# Patient Record
Sex: Female | Born: 1995 | Race: White | Hispanic: No | Marital: Married | Smoking: Never smoker
Health system: Southern US, Community
[De-identification: ages and names within clinical notes are randomized; demographics above are authoritative.]

## PROBLEM LIST (undated history)

## (undated) DIAGNOSIS — F909 Attention-deficit hyperactivity disorder, unspecified type: Secondary | ICD-10-CM

## (undated) HISTORY — DX: Attention-deficit hyperactivity disorder, unspecified type: F90.9

---
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
## 2016-12-04 ENCOUNTER — Observation Stay
Admission: EM | Admit: 2016-12-04 | Discharge: 2016-12-07 | Disposition: A | Discharge status: Home Health | Payer: TRICARE Prime—HMO | Attending: Hospitalist | Admitting: Hospitalist

## 2016-12-04 DIAGNOSIS — R200 Anesthesia of skin: Secondary | ICD-10-CM | POA: Insufficient documentation

## 2016-12-04 DIAGNOSIS — G8250 Quadriplegia, unspecified: Secondary | ICD-10-CM | POA: Diagnosis present

## 2016-12-04 DIAGNOSIS — F909 Attention-deficit hyperactivity disorder, unspecified type: Secondary | ICD-10-CM | POA: Insufficient documentation

## 2016-12-04 DIAGNOSIS — R531 Weakness: Secondary | ICD-10-CM | POA: Insufficient documentation

## 2016-12-04 DIAGNOSIS — R269 Unspecified abnormalities of gait and mobility: Secondary | ICD-10-CM

## 2016-12-04 DIAGNOSIS — R202 Paresthesia of skin: Principal | ICD-10-CM | POA: Insufficient documentation

## 2016-12-04 DIAGNOSIS — R2681 Unsteadiness on feet: Secondary | ICD-10-CM

## 2016-12-04 LAB — CBC AND DIFFERENTIAL
Absolute NRBC: 0 10*3/uL
Basophils Absolute Automated: 0.03 10*3/uL (ref 0.00–0.20)
Basophils Automated: 0.4 %
Eosinophils Absolute Automated: 0.25 10*3/uL (ref 0.00–0.70)
Eosinophils Automated: 3.7 %
Hematocrit: 39.2 % (ref 37.0–47.0)
Hgb: 13.7 g/dL (ref 12.0–16.0)
Immature Granulocytes Absolute: 0.03 10*3/uL
Immature Granulocytes: 0.4 %
Lymphocytes Absolute Automated: 2.7 10*3/uL (ref 0.50–4.40)
Lymphocytes Automated: 39.8 %
MCH: 29.7 pg (ref 28.0–32.0)
MCHC: 34.9 g/dL (ref 32.0–36.0)
MCV: 85 fL (ref 80.0–100.0)
MPV: 9.2 fL — ABNORMAL LOW (ref 9.4–12.3)
Monocytes Absolute Automated: 0.36 10*3/uL (ref 0.00–1.20)
Monocytes: 5.3 %
Neutrophils Absolute: 3.41 10*3/uL (ref 1.80–8.10)
Neutrophils: 50.4 %
Nucleated RBC: 0 /100 WBC (ref 0.0–1.0)
Platelets: 319 10*3/uL (ref 140–400)
RBC: 4.61 10*6/uL (ref 4.20–5.40)
RDW: 12 % (ref 12–15)
WBC: 6.78 10*3/uL (ref 3.50–10.80)

## 2016-12-04 LAB — URINALYSIS, REFLEX TO MICROSCOPIC EXAM IF INDICATED
Bilirubin, UA: NEGATIVE
Blood, UA: NEGATIVE
Glucose, UA: NEGATIVE
Ketones UA: NEGATIVE
Leukocyte Esterase, UA: NEGATIVE
Nitrite, UA: NEGATIVE
Protein, UR: NEGATIVE
Specific Gravity UA: 1.009 (ref 1.001–1.035)
Urine pH: 6 (ref 5.0–8.0)
Urobilinogen, UA: NORMAL mg/dL

## 2016-12-04 LAB — GFR: EGFR: >60

## 2016-12-04 LAB — MAGNESIUM: Magnesium: 2.2 mg/dL (ref 1.6–2.6)

## 2016-12-04 LAB — HEPATIC FUNCTION PANEL
ALT: 21 U/L (ref 0–55)
AST (SGOT): 17 U/L (ref 5–34)
Albumin/Globulin Ratio: 1.3 (ref 0.9–2.2)
Albumin: 4 g/dL (ref 3.5–5.0)
Alkaline Phosphatase: 48 U/L (ref 37–106)
Bilirubin Direct: 0.1 mg/dL (ref 0.0–0.5)
Bilirubin Indirect: 0.2 mg/dL (ref 0.0–1.1)
Bilirubin, Total: 0.3 mg/dL (ref 0.2–1.2)
Globulin: 3.2 g/dL (ref 2.0–3.6)
Protein, Total: 7.2 g/dL (ref 6.0–8.3)

## 2016-12-04 LAB — BASIC METABOLIC PANEL
BUN: 9 mg/dL (ref 7.0–19.0)
CO2: 25 mEq/L (ref 22–29)
Calcium: 9.8 mg/dL (ref 8.5–10.5)
Chloride: 106 mEq/L (ref 100–111)
Creatinine: 0.7 mg/dL (ref 0.6–1.0)
Glucose: 80 mg/dL (ref 70–100)
Potassium: 3.9 mEq/L (ref 3.5–5.1)
Sodium: 139 mEq/L (ref 136–145)

## 2016-12-04 LAB — POCT PREGNANCY TEST, URINE HCG: POCT Pregnancy HCG Test, UR: NEGATIVE

## 2016-12-04 LAB — PHOSPHORUS: Phosphorus: 4 mg/dL (ref 2.3–4.7)

## 2016-12-04 MED ORDER — SENNOSIDES-DOCUSATE SODIUM 8.6-50 MG PO TABS
2.0000 | ORAL_TABLET | Freq: Two times a day (BID) | ORAL | Status: DC | PRN
Start: 2016-12-04 — End: 2016-12-07

## 2016-12-04 MED ORDER — ACETAMINOPHEN 325 MG PO TABS
650.0000 mg | ORAL_TABLET | Freq: Four times a day (QID) | ORAL | Status: DC | PRN
Start: 2016-12-04 — End: 2016-12-07
  Administered 2016-12-05 – 2016-12-07 (×4): 650 mg via ORAL
  Filled 2016-12-04 (×4): qty 2

## 2016-12-04 MED ORDER — ONDANSETRON 4 MG PO TBDP
4.0000 mg | ORAL_TABLET | Freq: Four times a day (QID) | ORAL | Status: DC | PRN
Start: 2016-12-04 — End: 2016-12-07

## 2016-12-04 MED ORDER — CALCIUM CARBONATE ANTACID 500 MG PO CHEW
1000.0000 mg | CHEWABLE_TABLET | Freq: Four times a day (QID) | ORAL | Status: DC | PRN
Start: 2016-12-04 — End: 2016-12-07

## 2016-12-04 MED ORDER — ENOXAPARIN SODIUM 40 MG/0.4ML SC SOLN
40.0000 mg | Freq: Every day | SUBCUTANEOUS | Status: DC
Start: 2016-12-05 — End: 2016-12-05

## 2016-12-04 MED ORDER — SODIUM CHLORIDE 0.9 % IV BOLUS
1000.0000 mL | Freq: Once | INTRAVENOUS | Status: AC
Start: 2016-12-04 — End: 2016-12-04
  Administered 2016-12-04: 21:00:00 1000 mL via INTRAVENOUS

## 2016-12-04 MED ORDER — ONDANSETRON HCL 4 MG/2ML IJ SOLN
4.0000 mg | Freq: Four times a day (QID) | INTRAMUSCULAR | Status: DC | PRN
Start: 2016-12-04 — End: 2016-12-07

## 2016-12-04 NOTE — Progress Notes (Signed)
12/04/16 2256   CM Review   Acknowledgment of Outpatient/Observation Observation letter given                 Everardo Pacific, MSW  Emergency Department Social Worker  [facility]  [phone]  [email or web address]

## 2016-12-04 NOTE — ED Notes (Signed)
Initial brief evaluation done in triage to expedite care but I am not the primary provider. Pt presents to triage with progressive numbness and weakness for 2weeks. MRI brain normal at [facility]. Sent in by PCP to evaluate for Gb and/or MS       Juliane Poot, MD  12/04/16 2040

## 2016-12-04 NOTE — ED Provider Notes (Addendum)
[facility] EMERGENCY DEPARTMENT H&P      Visit date: 12/04/2016      CLINICAL SUMMARY          Diagnosis:    .     Final diagnoses:   Paresthesia   Gait disorder         MDM Notes:      I am the first provider for this patient.  I reviewed the vital signs, nursing notes, past medical history, past surgical history, family history and social history.  I have reviewed the patient's previous charts.    Considering pt's history, physical, lab results/imaging will admit for further care.  Discussed with admitting doctor observation/inpatient status.         Disposition:         Observation Admit      ED Disposition     ED Disposition Condition Date/Time Comment    Observation  Sun Dec 04, 2016 10:34 PM Admitting Physician: Sheran Luz [facility] [[id number]]   Diagnosis: Paresthesia [604540]   Estimated Length of Stay: < 2 midnights   Tentative Discharge Plan?: Home or Self Care [1]   Patient Class: Observation [104]                        CLINICAL INFORMATION        HPI:      Chief Complaint: Numbness  .    Meredith [name] is a 21 y.o. female with past medical history of ADHD on adderall who presents with right-sided numbness and tingling for 2 weeks. Reports that numbness started on her right lower extremity and then spread to her right arm and the right-side of her face. Also reports difficulty in taking a deep breath. Recently admitted to [facility] but had a negative MRI. Denies headache or vision change. Has appointment with neurology in mid-July.    History obtained from: Patient      ROS:      Positive and Negative ROS elements as per HPI  All Other Systems Reviewed and Negative: Yes      Physical Exam:      Pulse (!) 102  BP (!) 144/96  Resp 18  SpO2 99 %  Temp 97.7 F (36.5 C)    Physical Exam   Nursing note and vitals reviewed.  Constitutional:Pt is oriented to person, place, and time. Pt appears well-developed and well-nourished. No distress. Anxious appearing.   HEENT:   Head: Normocephalic and atraumatic.    Right Ear: External ear normal.   Left Ear: External ear normal.   Nose: Nose normal.   Mouth/Throat: Oropharynx is clear and moist. No oropharyngeal exudate.   Eyes: Conjunctivae and EOM are normal. Pupils are equal, round, and reactive to light. Right eye exhibits no discharge. Left eye exhibits no discharge. No scleral icterus.   Neck: Normal range of motion. Neck supple. No JVD present. No tracheal deviation present. No thyromegaly present.   Cardiovascular: Normal rate, regular rhythm, normal heart sounds and intact distal pulses.  Exam reveals no gallop and no friction rub.    No murmur heard.  Pulmonary/Chest: Effort normal and breath sounds normal. No stridor. No respiratory distress. Pt has no wheezes. Pt exhibits no tenderness.   Abdominal: There is no tenderness. Soft. Bowel sounds are normal. Pt exhibits no distension and no mass.  There is no rebound and no guarding.   Musculoskeletal:  No edema. FROM  Neurological:  No cranial nerve deficit. Pt exhibits  normal muscle tone. Moderate difficulty ambulation. Light touch decreased, pain intact   Skin: Skin is warm and dry. Pt is not diaphoretic.   Psychiatric: Pt has a normal mood and affect. Behavior is normal. Judgment and thought content normal            PAST HISTORY        Primary Care Provider: Bubba Camp, MD        PMH/PSH:    .     Past Medical History:   Diagnosis Date   . ADHD        She has a past surgical history that includes Cholecystectomy and Ovarian cyst removal.      Social/Family History:      She reports that she has never smoked. She has never used smokeless tobacco. She reports that she does not drink alcohol or use drugs.    Family History   Problem Relation Age of Onset   . Stroke Brother          Listed Medications on Arrival:    .     Home Medications     Med List Status:  Complete Set By: Meredith Peters, RN at 12/04/2016 11:37 PM                Amphetamine-Dextroamphetamine (ADDERALL PO)     Take 15 mg by mouth 2  (two) times daily.         norelgestromin-ethinyl estradiol (ORTHO EVRA) 150-35 MCG/24HR     Place 1 patch onto the skin Once each week on Thursday evening.         Allergies: She has No Known Allergies.            VISIT INFORMATION        Clinical Course in the ED:             Medications Given in the ED:    .     ED Medication Orders     Start Ordered     Status Ordering Provider    12/04/16 2043 12/04/16 2042  sodium chloride 0.9 % bolus 1,000 mL  Once     Route: Intravenous  Ordered Dose: 1,000 mL     Last MAR action:  New Bag Meredith [name] [facility]            Procedures:      Procedures      Interpretations:      Pulse ox reviewed by me. All ordered labs and images reviewed by me.     O2 [name]-           saturation: 99 %; Oxygen use: room air; Interpretation: Normal                   RESULTS        Lab Results:      Results     Procedure Component Value Units Date/Time    Urine HCG, POC/ Qualitative [[id number]] Collected:  12/04/16 2127    Specimen:  Urine Updated:  12/04/16 2212     POCT QC Pass     POCT Pregnancy HCG Test, UR Negative     Comment: Negative Value is Normal in Healthy Males or Healthy non-pregnant Females    UA, Reflex to Microscopic (pts  3 + yrs) [[id number]] Collected:  12/04/16 2059    Specimen:  Urine Updated:  12/04/16 2136     Urine Type Clean Catch     Color, UA Straw  Clarity, UA Clear     Specific Gravity UA 1.009     Urine pH 6.0     Leukocyte Esterase, UA Negative     Nitrite, UA Negative     Protein, UR Negative     Glucose, UA Negative     Ketones UA Negative     Urobilinogen, UA Normal mg/dL      Bilirubin, UA Negative     Blood, UA Negative    Basic Metabolic Panel [[id number]] Collected:  12/04/16 2044    Specimen:  Blood Updated:  12/04/16 2116     Glucose 80 mg/dL      BUN 9.0 mg/dL      Creatinine 0.7 mg/dL      Calcium 9.8 mg/dL      Sodium 914 mEq/L      Potassium 3.9 mEq/L      Chloride 106 mEq/L      CO2 25 mEq/L     Hepatic function panel (LFT) [[id number]] Collected:   12/04/16 2044    Specimen:  Blood Updated:  12/04/16 2116     Bilirubin, Total 0.3 mg/dL      Bilirubin, Direct 0.1 mg/dL      Bilirubin, Indirect 0.2 mg/dL      AST (SGOT) 17 U/L      ALT 21 U/L      Alkaline Phosphatase 48 U/L      Protein, Total 7.2 g/dL      Albumin 4.0 g/dL      Globulin 3.2 g/dL      Albumin/Globulin Ratio 1.3    Magnesium [[id number]] Collected:  12/04/16 2044    Specimen:  Blood Updated:  12/04/16 2116     Magnesium 2.2 mg/dL     Phosphorus [[id number]] Collected:  12/04/16 2044    Specimen:  Blood Updated:  12/04/16 2116     Phosphorus 4.0 mg/dL     GFR [[id number]] Collected:  12/04/16 2044     Updated:  12/04/16 2116     EGFR >60.0    CBC with differential [[id number]]  (Abnormal) Collected:  12/04/16 2044    Specimen:  Blood from Blood Updated:  12/04/16 2102     WBC 6.78 x10 3/uL      Hgb 13.7 g/dL      Hematocrit 74.2 %      Platelets 319 x10 3/uL      RBC 4.61 x10 6/uL      MCV 85.0 fL      MCH 29.7 pg      MCHC 34.9 g/dL      RDW 12 %      MPV 9.2 (L) fL      Neutrophils 50.4 %      Lymphocytes Automated 39.8 %      Monocytes 5.3 %      Eosinophils Automated 3.7 %      Basophils Automated 0.4 %      Immature Granulocyte 0.4 %      Nucleated RBC 0.0 /100 WBC      Neutrophils Absolute 3.41 x10 3/uL      Abs Lymph Automated 2.70 x10 3/uL      Abs Mono Automated 0.36 x10 3/uL      Abs Eos Automated 0.25 x10 3/uL      Absolute Baso Automated 0.03 x10 3/uL      Absolute Immature Granulocyte 0.03 x10 3/uL      Absolute NRBC 0.00 x10 3/uL  Radiology Results:      No orders to display               Scribe Attestation:      I was acting as a Neurosurgeon for Arlana Lindau, MD on Tompkins,Keisa C  Treatment Team: Scribe: Meredith [name]    I am the first provider for this patient and I personally performed the services documented. Treatment Team: Scribe: Meredith [name] is scribing for me on Hardie,Tjuana C. This note accurately reflects work and decisions made by me.  Arlana Lindau, MD             Arlana Lindau, MD  12/05/16 1610       Arlana Lindau, MD  12/05/16 [phone]

## 2016-12-04 NOTE — H&P (Addendum)
[facility] HOSPITALIST ADMISSION HISTORY AND PHYSICAL EXAM    Date Time: 12/04/16 10:29 PM  Patient Name: Meredith [name]  Attending Physician: Arlana Lindau, MD  Primary Care Physician: Bubba Camp, MD    CC: bilateral leg and arm paresthesias and weakness      History of Presenting Illness:   Meredith [name] is a 21 y.o. female hx ADHD, cholecystectomy, ovarian cyst removal who presents to the hospital with bilateral leg and arm paresthesias and weakness. This has been for 14 days. It started with right foot and lower leg paresthesias which went up to her right arm. She was admitted to [facility] 5/29 and had MRI brain / MRA head + neck which were unremarkable. She was referred to outpatient neurology (Dr. Odie Sera) but the next available appointment was in July. After she was hospitalized she developed left leg and arm paresthesias and weakness, difficulty walking. She had MRI [name]-spine and L-spine outpatient 6/1 which were unremarkable. Her symptoms worsened so her PMD referred her here for Guillain-Barre or multiple sclerosis evaluation. These symptoms are sudden onset, moderate intensity, without alleviating factors.     She noted having "stomach flu" with vomiting, diarrhea, and fever 3 weeks ago. She was told she had scoliosis on her imaging studies.     Past Medical History:     Past Medical History:   Diagnosis Date   . ADHD        Past Surgical History:     Past Surgical History:   Procedure Laterality Date   . CHOLECYSTECTOMY     . OVARIAN CYST REMOVAL         Family History:   History reviewed. No pertinent family history.   Her brother had a stroke at birth and had brain surgery but has no deficits now.    Social History:     Social History     Social History   . Marital status: Single     Spouse name: N/A   . Number of children: N/A   . Years of education: N/A     Social History Main Topics   . Smoking status: Never Smoker   . Smokeless tobacco: Never Used   . Alcohol use No   . Drug use: Unknown   . Sexual  activity: Not on file     Other Topics Concern   . Not on file     Social History Narrative   . No narrative on file       Allergies:   No Known Allergies    Medications:     (Not in a hospital admission)        Review of Systems:   All other systems were reviewed and are negative except: as above    Physical Exam:     Vitals:    12/04/16 2200   BP: 127/78   Pulse: 83   Resp:    Temp:    SpO2: 97%       Intake and Output Summary (Last 24 hours) at Date Time  No intake or output data in the 24 hours ending 12/04/16 2229    General: awake, alert, oriented x 3; no acute distress.  HEENT: perrla, eomi, sclera anicteric  oropharynx clear without lesions, mucous membranes moist  Neck: supple, no lymphadenopathy, no thyromegaly, no JVD, no carotid bruits  Cardiovascular: regular rate and rhythm, no murmurs, rubs or gallops  Lungs: clear to auscultation bilaterally, without wheezing, rhonchi, or rales  Abdomen: soft, non-tender, non-distended;  no palpable masses, no hepatosplenomegaly, normoactive bowel sounds, no rebound or guarding  Extremities: no clubbing, cyanosis, or edema  Neuro: cranial nerves grossly intact, strength 4-/5 in upper and 3/5 in lower extremities, paresthesias both arms and legs and chin, DTRs 1+/4 bilateral knees, follows commands 100% of the time  Skin: no rashes or lesions noted      Labs:     Results     Procedure Component Value Units Date/Time    Urine HCG, POC/ Qualitative [[id number]] Collected:  12/04/16 2127    Specimen:  Urine Updated:  12/04/16 2212     POCT QC Pass     POCT Pregnancy HCG Test, UR Negative     Comment: Negative Value is Normal in Healthy Males or Healthy non-pregnant Females    UA, Reflex to Microscopic (pts  3 + yrs) [[id number]] Collected:  12/04/16 2059    Specimen:  Urine Updated:  12/04/16 2136     Urine Type Clean Catch     Color, UA Straw     Clarity, UA Clear     Specific Gravity UA 1.009     Urine pH 6.0     Leukocyte Esterase, UA Negative     Nitrite, UA Negative      Protein, UR Negative     Glucose, UA Negative     Ketones UA Negative     Urobilinogen, UA Normal mg/dL      Bilirubin, UA Negative     Blood, UA Negative    Basic Metabolic Panel [[id number]] Collected:  12/04/16 2044    Specimen:  Blood Updated:  12/04/16 2116     Glucose 80 mg/dL      BUN 9.0 mg/dL      Creatinine 0.7 mg/dL      Calcium 9.8 mg/dL      Sodium 295 mEq/L      Potassium 3.9 mEq/L      Chloride 106 mEq/L      CO2 25 mEq/L     Hepatic function panel (LFT) [[id number]] Collected:  12/04/16 2044    Specimen:  Blood Updated:  12/04/16 2116     Bilirubin, Total 0.3 mg/dL      Bilirubin, Direct 0.1 mg/dL      Bilirubin, Indirect 0.2 mg/dL      AST (SGOT) 17 U/L      ALT 21 U/L      Alkaline Phosphatase 48 U/L      Protein, Total 7.2 g/dL      Albumin 4.0 g/dL      Globulin 3.2 g/dL      Albumin/Globulin Ratio 1.3    Magnesium [[id number]] Collected:  12/04/16 2044    Specimen:  Blood Updated:  12/04/16 2116     Magnesium 2.2 mg/dL     Phosphorus [[id number]] Collected:  12/04/16 2044    Specimen:  Blood Updated:  12/04/16 2116     Phosphorus 4.0 mg/dL     GFR [[id number]] Collected:  12/04/16 2044     Updated:  12/04/16 2116     EGFR >60.0    CBC with differential [[id number]]  (Abnormal) Collected:  12/04/16 2044    Specimen:  Blood from Blood Updated:  12/04/16 2102     WBC 6.78 x10 3/uL      Hgb 13.7 g/dL      Hematocrit 16.6 %      Platelets 319 x10 3/uL      RBC 4.61 x10 6/uL      MCV 85.0  fL      MCH 29.7 pg      MCHC 34.9 g/dL      RDW 12 %      MPV 9.2 (L) fL      Neutrophils 50.4 %      Lymphocytes Automated 39.8 %      Monocytes 5.3 %      Eosinophils Automated 3.7 %      Basophils Automated 0.4 %      Immature Granulocyte 0.4 %      Nucleated RBC 0.0 /100 WBC      Neutrophils Absolute 3.41 x10 3/uL      Abs Lymph Automated 2.70 x10 3/uL      Abs Mono Automated 0.36 x10 3/uL      Abs Eos Automated 0.25 x10 3/uL      Absolute Baso Automated 0.03 x10 3/uL      Absolute Immature Granulocyte 0.03 x10 3/uL       Absolute NRBC 0.00 x10 3/uL           Radiology Results (24 Hour)     ** No results found for the last 24 hours. **        MRI brain noncontrast 5/[address]):  IMPRESSION:  No acute intracranial process. No findings to suggest acute hemorrhage, mass lesion, or acute infarction.    MRA head noncontrast 5/[address]):  IMPRESSION:  Brain MRA demonstrates no high-grade stenosis. Mildly decreased signal at the proximal supraclinoid segments of the internal carotid arteries is favored to be artificual. Mild stenosis is thought to be less likely but if clnically indicated, CTA would be helpful for additional evaluation.    MRA neck with/without contrast 5/[address]):  IMPRESSION:  No significant stenosis of the carotid bifurcations, cervical internal carotid arteries, or vertebral arteries.    MRI lumbar spine 6/1:  IMPRESSION:  Mild left L5 foraminal narrowing from facet disease.    MRI cervical spine 6/1:  IMPRESSION:  1. Punctate high T2 signal in the cervical spinal cord at the C6-C7 level. Differential considerations of prominence of the central spinal canal and tiny syrinx.  2. No significant central canal or foraminal narrowing. No significant degenerative change.    Assessment:   There is no problem list on file for this patient.      21 yo female hx ADHD, cholecystectomy, ovarian cyst removal who presents with bilateral leg and arm paresthesias and weakness.    Plan:   Bilateral leg and arm paresthesias and weakness - Rule out Guillain-Barre syndrome. Neuro checks. TSH, B12. PT/OT. EMG. Defer LP to neurology. NIF/VC.    Hx ADHD, cholecystectomy, ovarian cyst removal - Noted.    DVT/GI prophylaxis - Lovenox, SCDs.     Status/Rationale: Observation neurology ward. Please note I called [facility] neurology [phone]) at 6:10 for consultation.    Signed by: Melton Krebs, MD   EA:[id number], Purvis Sheffield, MD

## 2016-12-05 ENCOUNTER — Encounter: Payer: Self-pay | Admitting: Hospitalist

## 2016-12-05 DIAGNOSIS — G8250 Quadriplegia, unspecified: Secondary | ICD-10-CM | POA: Diagnosis present

## 2016-12-05 LAB — CBC AND DIFFERENTIAL
Absolute NRBC: 0 10*3/uL
Basophils Absolute Automated: 0.02 10*3/uL (ref 0.00–0.20)
Basophils Automated: 0.4 %
Eosinophils Absolute Automated: 0.24 10*3/uL (ref 0.00–0.70)
Eosinophils Automated: 4.6 %
Hematocrit: 33.5 % — ABNORMAL LOW (ref 37.0–47.0)
Hgb: 11.8 g/dL — ABNORMAL LOW (ref 12.0–16.0)
Immature Granulocytes Absolute: 0.02 10*3/uL
Immature Granulocytes: 0.4 %
Lymphocytes Absolute Automated: 2.38 10*3/uL (ref 0.50–4.40)
Lymphocytes Automated: 45.7 %
MCH: 30 pg (ref 28.0–32.0)
MCHC: 35.2 g/dL (ref 32.0–36.0)
MCV: 85.2 fL (ref 80.0–100.0)
MPV: 9.4 fL (ref 9.4–12.3)
Monocytes Absolute Automated: 0.36 10*3/uL (ref 0.00–1.20)
Monocytes: 6.9 %
Neutrophils Absolute: 2.19 10*3/uL (ref 1.80–8.10)
Neutrophils: 42 %
Nucleated RBC: 0 /100 WBC (ref 0.0–1.0)
Platelets: 240 10*3/uL (ref 140–400)
RBC: 3.93 10*6/uL — ABNORMAL LOW (ref 4.20–5.40)
RDW: 12 % (ref 12–15)
WBC: 5.21 10*3/uL (ref 3.50–10.80)

## 2016-12-05 LAB — C-REACTIVE PROTEIN: C-Reactive Protein: <0.1 mg/dL (ref 0.0–0.8)

## 2016-12-05 LAB — TSH: TSH: 2.27 u[IU]/mL (ref 0.35–4.94)

## 2016-12-05 LAB — PT AND APTT
PT INR: 1 (ref 0.9–1.1)
PT: 13.4 s (ref 12.6–15.0)
PTT: 31 s (ref 23–37)

## 2016-12-05 LAB — VITAMIN B12: Vitamin B-12: 258 pg/mL (ref 211–911)

## 2016-12-05 LAB — SEDIMENTATION RATE: Sed Rate: 9 mm/Hr (ref 0–20)

## 2016-12-05 LAB — MAGNESIUM: Magnesium: 1.7 mg/dL (ref 1.6–2.6)

## 2016-12-05 LAB — BASIC METABOLIC PANEL
BUN: 10 mg/dL (ref 7.0–19.0)
CO2: 23 mEq/L (ref 22–29)
Calcium: 9.1 mg/dL (ref 8.5–10.5)
Chloride: 109 mEq/L (ref 100–111)
Creatinine: 0.6 mg/dL (ref 0.6–1.0)
Glucose: 90 mg/dL (ref 70–100)
Potassium: 3.9 mEq/L (ref 3.5–5.1)
Sodium: 138 mEq/L (ref 136–145)

## 2016-12-05 LAB — GFR: EGFR: >60

## 2016-12-05 LAB — HEMOLYSIS INDEX: Hemolysis Index: 7 (ref 0–18)

## 2016-12-05 MED ORDER — ENOXAPARIN SODIUM 40 MG/0.4ML SC SOLN
40.0000 mg | Freq: Every day | SUBCUTANEOUS | Status: DC
Start: 2016-12-06 — End: 2016-12-07
  Administered 2016-12-06 – 2016-12-07 (×2): 40 mg via SUBCUTANEOUS
  Filled 2016-12-05 (×2): qty 0.4

## 2016-12-05 NOTE — OT Eval Note (Signed)
[facility]   Occupational Therapy Evaluation     Patient: Meredith [name]    MRN#: [id number]   Unit: [facility] TOWER 8 EAST  Bed: F821/F821.01                                     Discharge Recommendations:   Discharge Recommendation: Acute Rehab   DME Recommended for Discharge:  (tbd at facility)    If Acute Rehab recommended discharge disposition is not available, patient will need hands-on assist for all mobility, RW for equipment, and HHOT.        Assessment:   Meredith [name] is a 21 y.o. female admitted 12/04/2016. Pt pleasant and cooperative throughout session, requiring ample time to complete all mobility and transfers 2/2 sensation deficits in BUE and BLE - pt c/o numbness and "pins and needles" throughout extremities. Pt performed bed mobility with ample time and (S). Pt performing ROM, MMT and coordination tasks [facility] however with slowed motor movements. Pt able to donn/doff BLE garments in forward flexed position with (S) and ample time. Pt performed sit <> stand transfer with (CS), tolerating x7min static stand in preparation for functional mobility. Pt ambulated household distance with slow, unbalanced gait and (CS). Pt performed sit <> stand transfer on/off standard commode with ModI use of toilet grab bars. Pt ambulated additional household distance with short, unbalanced gait and (CS). Pt educated on POC, therapeutic process and all questions answered. Pt below baseline and likely to progress well with continued OT services while in house to promote a return to highest level of functional performance. Pt will benefit from, and tolerate 3+ hrs of therapy per day at time of D/C.       Therapy Diagnosis: Decreased balance, decreased strength  Rehabilitation Potential:  Good for stated goals with continued Occupational Therapy treatment intervention    Treatment Activities: OT evaluation, self-care, thera-act, patient/family received education and demonstrates understanding and  carryover.   Educated the patient to role of occupational therapy, plan of care, goals of therapy and safety with mobility and ADLs.    Plan:   OT Frequency Recommended: 3-4x/wk     Treatment/Interventions: ADL training, functional transfer training, modified bed mobility training, education on energy conservation/fall prevention techniques, compensatory technique education, UE strengthening, endurance training, Relaxation/pain management techniques, education on medical equipment beneficial for increasing ADL performance and safety in home environment.      Risks/benefits/POC discussed: Yes       Precautions and Contraindications:   Fall risk,     Consult received for Meredith [name] for OT Evaluation and Treatment.  Patient's medical condition is appropriate for Occupational Therapy intervention at this time.      History of Present Illness:    Meredith [name] is a 21 y.o. female admitted on 12/04/2016 with "hx ADHD, cholecystectomy, ovarian cyst removal who presents to the hospital with bilateral leg and arm paresthesias and weakness. This has been for 14days. It started with right foot and lower leg paresthesias which went up to her right arm. [facility] brain / MRA head + neck which wereunremarkable. She was referred to outpatient neurology (Dr. Odie Sera) but the next available appointment was in July. After she was hospitalized she developed left leg and arm paresthesias and weakness, difficulty walking. She had MRI C-spine and L-spine outpatient 6/1 which were unremarkable." per  H&P    Admitting Diagnosis: Paresthesia [R20.2]  Gait disorder [R26.9]  Unsteady gait [R26.81]    Past Medical/Surgical History:  Past Medical History:   Diagnosis Date   . ADHD      Past Surgical History:   Procedure Laterality Date   . CHOLECYSTECTOMY     . OVARIAN CYST REMOVAL         Imaging/Tests/Labs:  No results found.    Social History:   Prior Level of Function:  Prior level of function:  Independent with ADLs, Ambulates independently  Baseline Activity Level: Community ambulation  Driving: independent  Employment:  (works two jobs)  DME Currently at [employer]:  (None)    Home Living Arrangements:  Living Arrangements: Spouse/significant other (takes care of her 22 y.o. dtr)  Type of Home: Apartment  Home Layout: One level (Flight of steps to enter)  DME Currently at Home:  (None)    Subjective: none reported     Patient is agreeable to participation in the therapy session. Nursing clears patient for therapy.     Patient Goal: None reported  Pain:   Scale: 4/10  Location: Neck  Intervention: RN made aware, positioning and rest     Objective:   Patient is in bed with SCD's and IV in place.      Cognitive Status and Neuro Exam:  Alert and oriented x4    Musculoskeletal Examination  RUE ROM: grossly WFL  LUE ROM: grossly WFL  RLE ROM: WFL grossly   LLE ROM: WFL grossly     RUE Strength: 3+ /5 grossly   LUE Strength: 3+ /5 grossly   RLE Strength: 3-/5 grossly   LLE Strength: 3-/5 grossly     Sensory/Oculomotor Examination  Auditory:  appears WFL, pt able to hear writer without noted difficulty   Tactile:  "pins and needles" in BUE and BLE  Vision:  appears WFL; no deficits noted in acuity or tracking.    Coordination: WFL       Activities of Daily Living  Eating: hand to mouth WFL   Grooming: NT   Bathing: NT  UE Dressing: NT  LE Dressing: ModI  Toileting: NT    Functional Mobility:  Supine to Sit: (I) with ample time  Sit to Stand: (CS)  Transfers: (CS)     Balance  Static Sitting: (I)  Dynamic Sitting: (S)  Static Standing: (S)  Dynamic Standing: (CS)    Participation and Activity Tolerance  Participation Effort: good  Endurance: Fair    Patient left with call bell within reach, all needs met, SCDs, fall mat, bed alarm in place and all questions answered. RN notified of session outcome and patient response.       Goals:  Time For Goal Achievement: 7 visits  ADL Goals  Patient will groom self: Stand by  Assist, at sinkside  Pt will complete bathing: Stand by Assist     Neuro Re-Ed Goals  Pt will perform dynamic standing balance: Stand by Assist, for 15 minutes, to complete standing ADLs safely  Musculoskeletal Goals  Pt will perform fine motor coordination tasks: with supervision, feeding, grooming, w/ home exercise program, to increase ability to complete ADLs                     Salli Quarry OTR/L  Pager [phone]      Time of treatment:   OT Received On: 12/05/16  Start Time: 0805  Stop Time: 0845  Time Calculation (min): 40  min

## 2016-12-05 NOTE — UM Notes (Signed)
Place admit to observation:   12/04/16 2234    bilateral leg and arm paresthesias and weakness        21 y.o. female hx ADHD, cholecystectomy, ovarian cyst removal who presents to the hospital with bilateral leg and arm paresthesias and weakness. This has been for 14 days. It started with right foot and lower leg paresthesias which went up to her right arm. She was admitted to [facility] 5/29 and had MRI brain / MRA head + neck which were unremarkable. She was referred to outpatient neurology (Dr. Odie Sera) but the next available appointment was in July. After she was hospitalized she developed left leg and arm paresthesias and weakness, difficulty walking. She had MRI C-spine and L-spine outpatient 6/1 which were unremarkable. Her symptoms worsened so her PMD referred her here for Guillain-Barre or multiple sclerosis evaluation. These symptoms are sudden onset, moderate intensity, without alleviating factors.     She noted having "stomach flu" with vomiting, diarrhea, and fever 3 weeks ago. She was told she had scoliosis on her imaging studies.        Plan:   Bilateral leg and arm paresthesias and weakness - Rule out Guillain-Barre syndrome. Neuro checks. TSH, B12. PT/OT. EMG. Defer LP to neurology. NIF/VC.    Hx ADHD, cholecystectomy, ovarian cyst removal - Noted.    DVT/GI prophylaxis - Lovenox, SCDs.     Status/Rationale: Observation neurology ward.    Kendrick Ranch  RN/CM  [facility]  Utilization Review Nurse  Cell # [phone]  Office # [phone]

## 2016-12-05 NOTE — PT Eval Note (Addendum)
[facility]   Physical Therapy Evaluation   Patient: Meredith [name]    MRN#: [id number]   Unit: [facility] TOWER 8 EAST  Bed: F821/F821.01    Discharge Recommendations:   Discharge Recommendation: Acute Rehab   DME Recommendation: DME Recommended for Discharge:  (TBD at rehab)    If Acute Rehab recommended discharge disposition is not available, patient will need hands-on assist for all mobility, RW for equipment, and HHPT.        Assessment:   Meredith [name] is a 21 y.o. female admitted 12/04/2016.  Pt presents with impaired mobility due to impaired sensation, generalized decreased strength, decreased endurance, decreased balance.  Pt will benefit from PT intervention to maximize functional independence and safety with gait/stairs.  Pt was active and independent, working two jobs PTA. She is very motivated and currently is at high risk for falls due to above deficits. Recommend acute rehab on discharge to assist her achieving her maximum level of functional mobility.  Pt would make an excellent rehab candidate due to her high motivation and ability to tolerate 3 hours of therapy each day.      Therapy Diagnosis: Impaired mobility, abnormal gait    Rehabilitation Potential: Prognosis: Good;With continued PT status post acute discharge    Treatment Activities:   PT evaluation, bed mobility, transfers, gait training, therapeutic exercise and pt/family education, see below.      Patient Education:  Educated the patient to role of physical therapy, plan of care, goals and benefits of therapy and activity/mobilization, safety with mobility, D/C recommendations         Plan:   Treatment/Interventions: Exercise;Gait training;Stair training;Neuromuscular re-education;Functional transfer training;LE strengthening/ROM;Patient/family training;Equipment eval/education;Bed mobility;Compensatory technique education     PT Frequency: 3-4x/wk     Risks/Benefits/POC Discussed with Pt/Family: With patient         Precautions and Contraindications:   Falls  Generalized weakness/sensory deficits    Consult received for Sherilyn Banker for PT Evaluation and Treatment.    Patient's medical condition is appropriate for Physical therapy intervention at this time.    Medical Diagnosis: Paresthesia [R20.2]  Gait disorder [R26.9]  Unsteady gait [R26.81]      History of Present Illness:   DAIANNA [name] is a 21 y.o. female admitted on 12/04/2016 with " hx ADHD, cholecystectomy, ovarian cyst removal who presents to the hospital with bilateral leg and arm paresthesias and weakness. This has been for 14 days. It started with right foot and lower leg paresthesias which went up to her right arm. She was admitted to [facility] 5/29 and had MRI brain / MRA head + neck which were unremarkable. She was referred to outpatient neurology (Dr. Odie Sera) but the next available appointment was in July. After she was hospitalized she developed left leg and arm paresthesias and weakness, difficulty walking. She had MRI C-spine and L-spine outpatient 6/1 which were unremarkable." per H&P      Past Medical/Surgical History:  Past Medical History:   Diagnosis Date   . ADHD      Past Surgical History:   Procedure Laterality Date   . CHOLECYSTECTOMY     . OVARIAN CYST REMOVAL           X-Rays/Tests/Labs:  MRI brain noncontrast 5/29 [address]):  IMPRESSION:  No acute intracranial process. No findings to suggest acute hemorrhage, mass lesion, or acute infarction.     MRA head noncontrast 5/[address]):  IMPRESSION:  Brain MRA demonstrates no high-grade  stenosis. Mildly decreased signal at the proximal supraclinoid segments of the internal carotid arteries is favored to be artificual. Mild stenosis is thought to be less likely but if clnically indicated, CTA would be helpful for additional evaluation.     MRA neck with/without contrast 5/[address]):  IMPRESSION:  No significant stenosis of the carotid bifurcations, cervical internal carotid arteries, or  vertebral arteries.     MRI lumbar spine 6/1:  IMPRESSION:  Mild left L5 foraminal narrowing from facet disease.     MRI cervical spine 6/1:  IMPRESSION:  1. Punctate high T2 signal in the cervical spinal cord at the C6-C7 level. Differential considerations of prominence of the central spinal canal and tiny syrinx.  2. No significant central canal or foraminal narrowing. No significant degenerative change.    Social History:   Prior Level of Function:  Prior level of function: Independent with ADLs, Ambulates independently  Baseline Activity Level: Community ambulation  Driving: independent  Employment:  (works two jobs)  DME Currently at [employer]:  (None)    Home Living Arrangements:  Living Arrangements: Spouse/significant other (takes care of her 22 y.o. dtr)  Type of Home: Apartment  Home Layout: One level (Flight of steps to enter)  DME Currently at Home:  (None)    Subjective: I can't feel my feet!   Patient is agreeable to participation in the therapy session. Nursing clears patient for therapy.     Patient Goal:  (To get better)      Pain Assessment:  Rating: 3-4/10  Location: Neck and low back  Intervention:  Mobilized and positioned,nursing informed    Cognition  Intact, pleasant and cooperative.  Pt is very motivated.      Objective:   Patient is in bed with peripheral IV in place.    Neuro:  Impaired sensation generally       Musculoskeletal Examination:  Gross ROM  Right Upper Extremity ROM: within functional limits  Left Upper Extremity ROM: within functional limits  Right Lower Extremity ROM: within functional limits  Left Lower Extremity ROM: within functional limits    Gross Strength  Right Lower Extremity Strength:  (2+-3-/5)  Left Lower Extremity Strength:  (3-/5)         Functional Mobility:  Rolling: Independent  Supine to Sit: Independent  Scooting to HOB: Independent  Scooting to EOB: Stand by Assist  Sit to Supine: Stand by Assist  Sit to Stand: Contact Guard Assist;Minimal Assist;Increased  Time;Increased Effort;with instruction for hand placement to increase safety  Stand to Sit: Risk analyst (with VCs for hand placement and technique)         PMP - Progressive Mobility Protocol   PMP Activity: Step 7 - Walks out of Room  Distance Walked (ft) (Step 6,7): 100 Feet (50'x2)    Ambulation:  Ambulation:  (Min without device, SB-CG with RW)  Distance: 50' x2  Pattern: R foot decreased clearance;L foot decreased clearance;R foot flat;L foot flat;decreased cadence;decreased step length, heavy reliance on RW (unsteady without device, improved with use of RW)       Balance:  Sitting (static/dynamic): I/I  Standing (static/dynamic):  CG/min without device, SB/B-CG with RW         PMP - Progressive Mobility Protocol   PMP Activity: Step 7 - Walks out of Room  Distance Walked (ft) (Step 6,7): 100 Feet (50'x2)    Participation and Activity Tolerance:  Participation Effort: good  Endurance: Endurance does not limit participation in activity  AM-PACT Inpatient Short Forms  Inpatient AM-PACT Performed? (PT): Basic Mobility Inpatient Short Form  AM-PACT "6 Clicks" Basic Mobility Inpatient Short Form  Turning Over in Bed: A little  Sitting Down On/Standing From Armchair: A little  Lying on Back to Sitting on Side of Bed: A little  Assist Moving to/from Bed to Chair: A little  Assist to Walk in Hospital Room: A little  Assist to Climb 3-5 Steps with Railing: A little  PT Basic Mobility Raw Score: 18  CMS 0-100% Score: 46.58%      Patient left with call bell within reach, all needs met, SCDs donned, fall mat yes, bed alarm n/a, chair alarm n/a and all questions answered. RN notified of session outcome and patient response.       Goals:   Goals  Goal Formulation: With patient  Time for Goal Acheivement: 3 visits  Goals: Select goal  Pt Will Transfer Bed/Chair: with stand by assist  Pt Will Ambulate: 101-150 feet;with rolling walker;with stand by assist  Pt Will Go Up / Down Stairs: 3-5 stairs;with minimal  assist;With rail         Time of treatment:   PT Received On: 12/05/16  Start Time: 1007  Stop Time: 1101  Time Calculation (min): 54 min      Thank you,    Suzan Garibaldi, PT, Pager [phone]

## 2016-12-05 NOTE — Progress Notes (Signed)
12/05/16 1700   Respiratory Parameters - Non Ventilated   Status Completed   Vital capacity 1500 mL   Negative inspiratory force -40 cm H2o   $ NIF DONE Yes   Adverse Reactions None   Performing Departments   Resp param - No vent performing department formula [id number]

## 2016-12-05 NOTE — Consults (Signed)
EMG and NCS    This is a 21 year old woman with both legs and arms weakness and numbness starting about 3 weeks ago. This gradually worsened.    Findings:  Right median palmar, ulnar palmar, radial, sural and left sural sensory responses were unremarkable. The right superficial sensory response showed slowed peak latency which was thought to be technical secondary to temperature and artifacts.  Right median, ulnar, common peroneal and tibial motor responses were unremarkable.  Right median, ulnar, common peroneal and tibial F wave responses were unremarkable.  Needle electromyography of 5 muscles of the right upper and lower extremities was unremarkable; the patient was not not able to give a good effort to assess voluntary motor unit potentials for right peroneus longus and medial gastrocnemius.    Impression:  This is an unremarkable study. There is no conclusive electrodiagnostic evidence of a generalized large fiber neuropathy, right cervical or lumbosacral radiculopathy or myopathy on this study.  If the patient's symptoms persist a repeat study could be done in a few weeks or sooner. Clinical and radiological correlation is recommended.    Gwenevere Abbot, MD - [facility]   Board Certified in Neurology by ABPN   Board Certified Clinical Neurophysiology by ABPN   Board Certified in Electrodiagnostic Medicine by ABEM

## 2016-12-05 NOTE — Progress Notes (Addendum)
Patient did NIF of -40 cm H2O and 1700 mL of VC

## 2016-12-05 NOTE — Consults (Signed)
[facility] Consultation Note                                       Date Time: 12/05/16 9:10 AM  Patient Name: Meredith [name]  Requesting Physician: Durene Romans, DO  Date of Admission: 12/04/2016    CC / Reason for Consultation: Weakness and paresthesias           Assessment:   21 y.o. female with a pmhx of ADHD on adderall who presents to the hospital 12/04/16 with bilateral arm and leg numbness and tingling, and weakness x 2 weeks.       Plan:   Continue home medications  Continue medical management by primary team  Follow up EMG studies  Supportive therapies:  PT/OT  DVT ppx  Further recommendations per Attending Neurologist    Attending note:   The patient was seen and examined by me. Interval history reviewed. All pertinent parts of the neurological exam were performed and confirmed by me, with any additional findings on exam noted in bold. I agree with the assessment and plan as outlined by the mid-level provider as above, with any additional considerations or recommendations as noted below.     c/o bilateral leg and arm paresthesia/ weakness: progressive   Inconsistent exam in weakness noted throughout. Lots of effort dependent.   EMG inconclusive, role of LP is equivocal as clinically not fitting to GBS  Would follow up: eventually need Psychiatry.  But recommend to continue PT/OT.       Rosalio Macadamia, MD - [place]  NEUROLOGY  Available on XTEND paging  Board Certified in Neurology by [employer]  Board Certified Clinical Neurophysiology by [employer]         HPI   Meredith [name] is a 21 y.o. female with a pmhx of ADHD on adderall who presents to the hospital 12/04/16 with bilateral arm and leg numbness and tingling, and weakness x 2 weeks.  Per medical records, her symptoms started with right foot and lower leg paresthesias which went up to her right arm. She was admitted to [facility] 5/29 and had MRI brain / MRA head + neck which were unremarkable. She was referred to outpatient neurology (Dr. Odie Sera) but the next available appointment was in July. After she was hospitalized she developed left leg and arm paresthesias and weakness, difficulty walking. She had MRI C-spine and L-spine outpatient 6/1 which were unremarkable. Her symptoms worsened so her PMD referred her here for Guillain-Barre vs multiple sclerosis evaluation.  She noted having "stomach flu" with vomiting, diarrhea, and fever 3 weeks ago. She was told she had scoliosis on her imaging studies.  On exam, patient is awake, alert, answering questions and following commands.  On exam her movements are hesitant but strong.  Sensation appears to be intact.  With hands out stretched, patient's hands were initially quiet on outstretch then started shaking.  Appears to be an effort based component to exam.  Patient reported that it felt like she was having trouble taking a deep breath yesterday.     Past Medical Hx     Past Medical History:   Diagnosis Date   . ADHD           Past Surgical Hx:     Past Surgical History:   Procedure Laterality Date   . CHOLECYSTECTOMY     . OVARIAN CYST REMOVAL  Family Medical History:      Family History   Problem Relation Age of Onset   . Stroke Brother        Social Hx     Social History     Social History   . Marital status: Single     Spouse name: N/A   . Number of children: N/A   . Years of education: N/A     Occupational History   . Not on file.     Social History Main Topics   . Smoking status: Never Smoker   . Smokeless tobacco: Never Used   . Alcohol use No   . Drug use: No   . Sexual activity: Not on file     Other Topics Concern   . Not on file     Social History Narrative   . No narrative on file       Meds     Home :   Prior to Admission medications    Medication Sig Start Date End Date Taking? Authorizing Provider   Amphetamine-Dextroamphetamine (ADDERALL PO) Take 15 mg by mouth 2 (two) times daily.       Yes [provider]   norelgestromin-ethinyl estradiol (ORTHO EVRA) 150-35 MCG/24HR  Place 1 patch onto the skin Once each week on Thursday evening.   Yes [provider]      Inpatient :   Current Facility-Administered Medications   Medication Dose Route Frequency   . [START ON 12/06/2016] enoxaparin  40 mg Subcutaneous Daily         Allergies    Patient has no known allergies.      Review of Systems   All other systems were reviewed and are negative except for that mentioned in the HPI    Physical Exam:   Temp:  [96.4 F (35.8 C)-98.3 F (36.8 C)] 98.3 F (36.8 C)  Heart Rate:  [71-102] 90  Resp Rate:  [16-18] 16  BP: (98-144)/(60-96) 98/65     Vital Signs:  Reviewed    General: The patient was well developed and well nourished.  No acute distress. Cooperative with the exam.  Follows commands  Neck:  no carotid bruits  CVS: RRR, no murmurs, rubs,or gallops  Resp: CTA bilaterally, no retractions  Abd: Soft, nontender  Extremities: no pedal edema, extremities normal in color    Mental Status: The patient was awake, alert and oriented to person, place, and time.  Affect is normal  Fund of knowledge appropriate  Recent and remote memory are intact   Attention span and concentration appear normal.  Language function is normal. There is no evidence of aphasia in conversational speech.    Cranial nerves:   -CN II: Visual fields full to bedside confrontation   -CN III, IV, VI: Pupils equal, round, and reactive to light; extraocular movements intact; no ptosis; no nystagmus              -CN V: Facial sensation intact in V1 through V3 distributions   -CN VII: Face symmetric   -CN VIII: Hearing intact to conversational speech   -CN IX, X: Normal phonation   -CN XI: Symmetric full strength of sternocleidomastoid and trapezius muscles   -CN XII: Tongue protrudes midline    Motor: Muscle tone normal without spasticity or flaccidity. No atrophy.  No pronator drift.  Strength  R / L    R / L  Deltoid  5 / 5  Hip Flexion 5 / 5  Triceps  5 / 5   Hip extension 5 / 5  Biceps  5 / 5   Knee flexion 5 /  5  Wrist ext 5 / 5  Knee ext 5 / 5  Wrist flexion 5 / 5  Dorsiflexion 5 / 5  FF   5 / 5  Plantar flexion 5 / 5    Sensory:   Light touch intact.  Temperature intact.  Vibration intact.  Proprioception intact.    Reflexes:  R / L     R / L  Biceps  2 / 2  Knees  2 / 2  Triceps 2 / 2  Ankles  2 / 2  BR                   2 / 2  Plantars Flexor / Flexor    Coordination: FTN  intact, no truncal ataxia. RAMs intact. No tremors    Gait: Deferred due to patient safety concerns      Labs:     Results     Procedure Component Value Units Date/Time    Hemolysis index [[id number]] Collected:  12/05/16 0357     Updated:  12/05/16 0656     Hemolysis Index 7    Vitamin B12 [[id number]] Collected:  12/05/16 0357    Specimen:  Blood Updated:  12/05/16 0610     Vitamin B-12 258 pg/mL     Sedimentation rate (ESR) [[id number]] Collected:  12/05/16 0357    Specimen:  Blood Updated:  12/05/16 0546     Sed Rate 9 mm/Hr     TSH [[id number]] Collected:  12/05/16 0357    Specimen:  Blood Updated:  12/05/16 0502     TSH 2.27 uIU/mL     Magnesium [[id number]] Collected:  12/05/16 0357    Specimen:  Blood Updated:  12/05/16 0448     Magnesium 1.7 mg/dL     C Reactive Protein [[id number]] Collected:  12/05/16 0357    Specimen:  Blood Updated:  12/05/16 0448     C-Reactive Protein <0.1 mg/dL     GFR [[id number]] Collected:  12/05/16 0357     Updated:  12/05/16 0448     EGFR >60.0    Basic Metabolic Panel [[id number]] Collected:  12/05/16 0357    Specimen:  Blood Updated:  12/05/16 0448     Glucose 90 mg/dL      BUN 88.4 mg/dL      Creatinine 0.6 mg/dL      Calcium 9.1 mg/dL      Sodium 166 mEq/L      Potassium 3.9 mEq/L      Chloride 109 mEq/L      CO2 23 mEq/L     PT/APTT [[id number]] Collected:  12/05/16 0357     Updated:  12/05/16 0438     PT 13.4 sec      PT INR 1.0     PT Anticoag. Given Within 48 hrs. None     PTT 31 sec     CBC with differential [[id number]]  (Abnormal) Collected:  12/05/16 0357    Specimen:  Blood from Blood Updated:  12/05/16 0435      WBC 5.21 x10 3/uL      Hgb 11.8 (L) g/dL      Hematocrit 20.2 (L) %      Platelets 240 x10 3/uL      RBC 3.93 (L) x10 6/uL      MCV 85.2 fL  MCH 30.0 pg      MCHC 35.2 g/dL      RDW 12 %      MPV 9.4 fL      Neutrophils 42.0 %      Lymphocytes Automated 45.7 %      Monocytes 6.9 %      Eosinophils Automated 4.6 %      Basophils Automated 0.4 %      Immature Granulocyte 0.4 %      Nucleated RBC 0.0 /100 WBC      Neutrophils Absolute 2.19 x10 3/uL      Abs Lymph Automated 2.38 x10 3/uL      Abs Mono Automated 0.36 x10 3/uL      Abs Eos Automated 0.24 x10 3/uL      Absolute Baso Automated 0.02 x10 3/uL      Absolute Immature Granulocyte 0.02 x10 3/uL      Absolute NRBC 0.00 x10 3/uL     Urine HCG, POC/ Qualitative [[id number]] Collected:  12/04/16 2127    Specimen:  Urine Updated:  12/04/16 2212     POCT QC Pass     POCT Pregnancy HCG Test, UR Negative     Comment: Negative Value is Normal in Healthy Males or Healthy non-pregnant Females    UA, Reflex to Microscopic (pts  3 + yrs) [[id number]] Collected:  12/04/16 2059    Specimen:  Urine Updated:  12/04/16 2136     Urine Type Clean Catch     Color, UA Straw     Clarity, UA Clear     Specific Gravity UA 1.009     Urine pH 6.0     Leukocyte Esterase, UA Negative     Nitrite, UA Negative     Protein, UR Negative     Glucose, UA Negative     Ketones UA Negative     Urobilinogen, UA Normal mg/dL      Bilirubin, UA Negative     Blood, UA Negative    Basic Metabolic Panel [[id number]] Collected:  12/04/16 2044    Specimen:  Blood Updated:  12/04/16 2116     Glucose 80 mg/dL      BUN 9.0 mg/dL      Creatinine 0.7 mg/dL      Calcium 9.8 mg/dL      Sodium 086 mEq/L      Potassium 3.9 mEq/L      Chloride 106 mEq/L      CO2 25 mEq/L     Hepatic function panel (LFT) [[id number]] Collected:  12/04/16 2044    Specimen:  Blood Updated:  12/04/16 2116     Bilirubin, Total 0.3 mg/dL      Bilirubin, Direct 0.1 mg/dL      Bilirubin, Indirect 0.2 mg/dL      AST (SGOT) 17 U/L      ALT  21 U/L      Alkaline Phosphatase 48 U/L      Protein, Total 7.2 g/dL      Albumin 4.0 g/dL      Globulin 3.2 g/dL      Albumin/Globulin Ratio 1.3    Magnesium [[id number]] Collected:  12/04/16 2044    Specimen:  Blood Updated:  12/04/16 2116     Magnesium 2.2 mg/dL     Phosphorus [[id number]] Collected:  12/04/16 2044    Specimen:  Blood Updated:  12/04/16 2116     Phosphorus 4.0 mg/dL     GFR [[id number]] Collected:  12/04/16 2044  Updated:  12/04/16 2116     EGFR >60.0    CBC with differential [[id number]]  (Abnormal) Collected:  12/04/16 2044    Specimen:  Blood from Blood Updated:  12/04/16 2102     WBC 6.78 x10 3/uL      Hgb 13.7 g/dL      Hematocrit 62.1 %      Platelets 319 x10 3/uL      RBC 4.61 x10 6/uL      MCV 85.0 fL      MCH 29.7 pg      MCHC 34.9 g/dL      RDW 12 %      MPV 9.2 (L) fL      Neutrophils 50.4 %      Lymphocytes Automated 39.8 %      Monocytes 5.3 %      Eosinophils Automated 3.7 %      Basophils Automated 0.4 %      Immature Granulocyte 0.4 %      Nucleated RBC 0.0 /100 WBC      Neutrophils Absolute 3.41 x10 3/uL      Abs Lymph Automated 2.70 x10 3/uL      Abs Mono Automated 0.36 x10 3/uL      Abs Eos Automated 0.25 x10 3/uL      Absolute Baso Automated 0.03 x10 3/uL      Absolute Immature Granulocyte 0.03 x10 3/uL      Absolute NRBC 0.00 x10 3/uL           Rads:   No results found for this or any previous visit.    Signed by,  Norvel Richards. Clemon Chambers, FNP-BC  Nurse Practitioner  [facility]  Pager: [phone]  Philis Kendall [phone]  After Hours: [phone]    Please see attending Neurologist note that accompanies this mid-level encounter note.

## 2016-12-05 NOTE — Progress Notes (Signed)
12/05/16 2336   Respiratory Parameters - Non Ventilated   Status Completed   Resp Rate 13   Vital capacity 1400 mL   $ Vital Capacity Performed? Yes   Negative inspiratory force -50 cm H2o   $ NIF DONE Yes   Adverse Reactions None

## 2016-12-05 NOTE — Plan of Care (Signed)
Problem: Safety  Goal: Patient will be free from injury during hospitalization  Outcome: Progressing   12/05/16 0211   Goal/Interventions addressed this shift   Patient will be free from injury during hospitalization  Assess patient's risk for falls and implement fall prevention plan of care per policy;Provide and maintain safe environment;Use appropriate transfer methods;Ensure appropriate safety devices are available at the bedside;Include patient/ family/ care giver in decisions related to safety;Hourly rounding       Problem: Moderate/High Fall Risk Score >5  Goal: Patient will remain free of falls  Outcome: Progressing   12/04/16 2352   OTHER   Moderate Risk (6-13) MOD-Initiate Yellow "Fall Risk" magnet communication tool       Comments: Received patient from ED in stable condition with reports of numbness and tingling that started in rt leg, rt arm, rt face then progressed to left leg, left arm and left face. She also reports the feeling of not able to take in full breaths but denies shortness of breath.  Her gait is unsteady and states her feet cannot feel the floor when walking.  I discussed fall intervention measures and to call for out of bed assistance.  Plan of care discussed.  She understands and accepts.

## 2016-12-05 NOTE — Progress Notes (Signed)
[facility] HOSPITALIST PROGRESS NOTE    Date Time: 12/05/16 1:50 PM  Patient Name: Meredith [name]  Attending Physician: Durene Romans, DO      History of Presenting Illness and Interval History/24 hour Events:   HPI per admitting MD:  "Meredith [name] is a 21 y.o. female hx ADHD, cholecystectomy, ovarian cyst removal who presents to the hospital with bilateral leg and arm paresthesias and weakness. This has been for 14 days. It started with right foot and lower leg paresthesias which went up to her right arm. She was admitted to [facility] 5/29 and had MRI brain / MRA head + neck which were unremarkable. She was referred to outpatient neurology (Dr. Odie Sera) but the next available appointment was in July. After she was hospitalized she developed left leg and arm paresthesias and weakness, difficulty walking. She had MRI C-spine and L-spine outpatient 6/1 which were unremarkable. Her symptoms worsened so her PMD referred her here for Guillain-Barre or multiple sclerosis evaluation. These symptoms are sudden onset, moderate intensity, without alleviating factors.     She noted having "stomach flu" with vomiting, diarrhea, and fever 3 weeks ago. She was told she had scoliosis on her imaging studies. "    6/4: No acute events last night.  Patient denied and the fever or chills diarrhea, but missed to the lower extremity weakness and her hand grips strength is decreased.  However, admits to some intermittent catching up of her breath.      Physical Exam:     Vitals:    12/05/16 1135   BP: 120/73   Pulse: 85   Resp: 16   Temp: 97.5 F (36.4 C)   SpO2: 95%       Intake and Output Summary (Last 24 hours) at Date Time    Intake/Output Summary (Last 24 hours) at 12/05/16 1350  Last data filed at 12/05/16 1000   Gross per 24 hour   Intake                0 ml   Output              950 ml   Net             -950 ml       General: awake, alert, oriented x 3; no acute distress.  HEENT: perrla, eomi, sclera anicteric  oropharynx clear  without lesions, mucous membranes moist  Neck: supple, no lymphadenopathy, no thyromegaly, no JVD, no carotid bruits  Cardiovascular: regular rate and rhythm, no murmurs, rubs or gallops  Lungs: clear to auscultation bilaterally, without wheezing, rhonchi, or rales  Abdomen: soft, non-tender, non-distended; no palpable masses, no hepatosplenomegaly, normoactive bowel sounds, no rebound or guarding  Extremities: no clubbing, cyanosis, or edema  Neuro: cranial nerves grossly intact, decreased bilateral hand grip strength, muscle strength in the lower extremities are 4- out of 5, sensation intact,   Skin: no rashes or lesions noted      Medications:     Current Facility-Administered Medications   Medication Dose Route Frequency   . [START ON 12/06/2016] enoxaparin  40 mg Subcutaneous Daily         Labs:     Results     Procedure Component Value Units Date/Time    Hemolysis index [[id number]] Collected:  12/05/16 0357     Updated:  12/05/16 0656     Hemolysis Index 7    Vitamin B12 [[id number]] Collected:  12/05/16 0357    Specimen:  Blood Updated:  12/05/16 0610     Vitamin B-12 258 pg/mL     Sedimentation rate (ESR) [[id number]] Collected:  12/05/16 0357    Specimen:  Blood Updated:  12/05/16 0546     Sed Rate 9 mm/Hr     TSH [[id number]] Collected:  12/05/16 0357    Specimen:  Blood Updated:  12/05/16 0502     TSH 2.27 uIU/mL     Magnesium [[id number]] Collected:  12/05/16 0357    Specimen:  Blood Updated:  12/05/16 0448     Magnesium 1.7 mg/dL     C Reactive Protein [[id number]] Collected:  12/05/16 0357    Specimen:  Blood Updated:  12/05/16 0448     C-Reactive Protein <0.1 mg/dL     GFR [[id number]] Collected:  12/05/16 0357     Updated:  12/05/16 0448     EGFR >60.0    Basic Metabolic Panel [[id number]] Collected:  12/05/16 0357    Specimen:  Blood Updated:  12/05/16 0448     Glucose 90 mg/dL      BUN 53.6 mg/dL      Creatinine 0.6 mg/dL      Calcium 9.1 mg/dL      Sodium 644 mEq/L      Potassium 3.9 mEq/L      Chloride  109 mEq/L      CO2 23 mEq/L     PT/APTT [[id number]] Collected:  12/05/16 0357     Updated:  12/05/16 0438     PT 13.4 sec      PT INR 1.0     PT Anticoag. Given Within 48 hrs. None     PTT 31 sec     CBC with differential [[id number]]  (Abnormal) Collected:  12/05/16 0357    Specimen:  Blood from Blood Updated:  12/05/16 0435     WBC 5.21 x10 3/uL      Hgb 11.8 (L) g/dL      Hematocrit 29.5 (L) %      Platelets 240 x10 3/uL      RBC 3.93 (L) x10 6/uL      MCV 85.2 fL      MCH 30.0 pg      MCHC 35.2 g/dL      RDW 12 %      MPV 9.4 fL      Neutrophils 42.0 %      Lymphocytes Automated 45.7 %      Monocytes 6.9 %      Eosinophils Automated 4.6 %      Basophils Automated 0.4 %      Immature Granulocyte 0.4 %      Nucleated RBC 0.0 /100 WBC      Neutrophils Absolute 2.19 x10 3/uL      Abs Lymph Automated 2.38 x10 3/uL      Abs Mono Automated 0.36 x10 3/uL      Abs Eos Automated 0.24 x10 3/uL      Absolute Baso Automated 0.02 x10 3/uL      Absolute Immature Granulocyte 0.02 x10 3/uL      Absolute NRBC 0.00 x10 3/uL     Urine HCG, POC/ Qualitative [[id number]] Collected:  12/04/16 2127    Specimen:  Urine Updated:  12/04/16 2212     POCT QC Pass     POCT Pregnancy HCG Test, UR Negative     Comment: Negative Value is Normal in Healthy Males or Healthy non-pregnant Females    UA, Reflex to Microscopic (pts  3 + yrs) [[id number]] Collected:  12/04/16 2059    Specimen:  Urine Updated:  12/04/16 2136     Urine Type Clean Catch     Color, UA Straw     Clarity, UA Clear     Specific Gravity UA 1.009     Urine pH 6.0     Leukocyte Esterase, UA Negative     Nitrite, UA Negative     Protein, UR Negative     Glucose, UA Negative     Ketones UA Negative     Urobilinogen, UA Normal mg/dL      Bilirubin, UA Negative     Blood, UA Negative    Basic Metabolic Panel [[id number]] Collected:  12/04/16 2044    Specimen:  Blood Updated:  12/04/16 2116     Glucose 80 mg/dL      BUN 9.0 mg/dL      Creatinine 0.7 mg/dL      Calcium 9.8 mg/dL       Sodium 782 mEq/L      Potassium 3.9 mEq/L      Chloride 106 mEq/L      CO2 25 mEq/L     Hepatic function panel (LFT) [[id number]] Collected:  12/04/16 2044    Specimen:  Blood Updated:  12/04/16 2116     Bilirubin, Total 0.3 mg/dL      Bilirubin, Direct 0.1 mg/dL      Bilirubin, Indirect 0.2 mg/dL      AST (SGOT) 17 U/L      ALT 21 U/L      Alkaline Phosphatase 48 U/L      Protein, Total 7.2 g/dL      Albumin 4.0 g/dL      Globulin 3.2 g/dL      Albumin/Globulin Ratio 1.3    Magnesium [[id number]] Collected:  12/04/16 2044    Specimen:  Blood Updated:  12/04/16 2116     Magnesium 2.2 mg/dL     Phosphorus [[id number]] Collected:  12/04/16 2044    Specimen:  Blood Updated:  12/04/16 2116     Phosphorus 4.0 mg/dL     GFR [[id number]] Collected:  12/04/16 2044     Updated:  12/04/16 2116     EGFR >60.0    CBC with differential [[id number]]  (Abnormal) Collected:  12/04/16 2044    Specimen:  Blood from Blood Updated:  12/04/16 2102     WBC 6.78 x10 3/uL      Hgb 13.7 g/dL      Hematocrit 59.5 %      Platelets 319 x10 3/uL      RBC 4.61 x10 6/uL      MCV 85.0 fL      MCH 29.7 pg      MCHC 34.9 g/dL      RDW 12 %      MPV 9.2 (L) fL      Neutrophils 50.4 %      Lymphocytes Automated 39.8 %      Monocytes 5.3 %      Eosinophils Automated 3.7 %      Basophils Automated 0.4 %      Immature Granulocyte 0.4 %      Nucleated RBC 0.0 /100 WBC      Neutrophils Absolute 3.41 x10 3/uL      Abs Lymph Automated 2.70 x10 3/uL      Abs Mono Automated 0.36 x10 3/uL      Abs Eos Automated 0.25 x10 3/uL  Absolute Baso Automated 0.03 x10 3/uL      Absolute Immature Granulocyte 0.03 x10 3/uL      Absolute NRBC 0.00 x10 3/uL             Radiology:     Radiology Results (24 Hour)     ** No results found for the last 24 hours. **          Lines:    Patient Lines/Drains/Airways Status    Active PICC Line / CVC Line / PIV Line / Drain / Airway / Intraosseous Line / Epidural Line / ART Line / Line / Wound / Pressure Ulcer / NG/OG Tube     Name:    Placement date:   Placement time:   Site:   Days:    Peripheral IV 12/04/16 Left Antecubital  12/04/16        Antecubital    1                  Assessment:     Patient Active Problem List   Diagnosis   . Bilateral leg and arm paresthesias   . Bilateral leg and arm weakness         21 yo female hx ADHD, cholecystectomy, ovarian cyst removal who presents with bilateral leg and arm paresthesias and weakness.    Plan:     Bilateral leg and arm paresthesias and weakness - Rule out Guillain-Barre syndrome. Neuro checks. TSH, B12. PT/OT. EMG pending. Defer LP to neurology. NIF is reduced 40/VC 1700cc, cw NIF and VC TID    Hx ADHD, cholecystectomy, ovarian cyst removal - Noted.    DVT/GI prophylaxis - Lovenox, SCDs.     Anticipated discharge disposition and date:    Signed by: Durene Romans, MD   QV:[id number], Purvis Sheffield, MD

## 2016-12-06 DIAGNOSIS — R531 Weakness: Secondary | ICD-10-CM

## 2016-12-06 DIAGNOSIS — E538 Deficiency of other specified B group vitamins: Secondary | ICD-10-CM

## 2016-12-06 LAB — BASIC METABOLIC PANEL
BUN: 10 mg/dL (ref 7.0–19.0)
CO2: 23 mEq/L (ref 22–29)
Calcium: 9.5 mg/dL (ref 8.5–10.5)
Chloride: 107 mEq/L (ref 100–111)
Creatinine: 0.7 mg/dL (ref 0.6–1.0)
Glucose: 90 mg/dL (ref 70–100)
Potassium: 4.2 mEq/L (ref 3.5–5.1)
Sodium: 138 mEq/L (ref 136–145)

## 2016-12-06 LAB — CBC AND DIFFERENTIAL
Absolute NRBC: 0 10*3/uL
Basophils Absolute Automated: 0.03 10*3/uL (ref 0.00–0.20)
Basophils Automated: 0.6 %
Eosinophils Absolute Automated: 0.25 10*3/uL (ref 0.00–0.70)
Eosinophils Automated: 4.8 %
Hematocrit: 38.1 % (ref 37.0–47.0)
Hgb: 13.2 g/dL (ref 12.0–16.0)
Immature Granulocytes Absolute: 0.03 10*3/uL
Immature Granulocytes: 0.6 %
Lymphocytes Absolute Automated: 1.86 10*3/uL (ref 0.50–4.40)
Lymphocytes Automated: 35.7 %
MCH: 29.3 pg (ref 28.0–32.0)
MCHC: 34.6 g/dL (ref 32.0–36.0)
MCV: 84.7 fL (ref 80.0–100.0)
MPV: 9 fL — ABNORMAL LOW (ref 9.4–12.3)
Monocytes Absolute Automated: 0.36 10*3/uL (ref 0.00–1.20)
Monocytes: 6.9 %
Neutrophils Absolute: 2.68 10*3/uL (ref 1.80–8.10)
Neutrophils: 51.4 %
Nucleated RBC: 0 /100 WBC (ref 0.0–1.0)
Platelets: 268 10*3/uL (ref 140–400)
RBC: 4.5 10*6/uL (ref 4.20–5.40)
RDW: 12 % (ref 12–15)
WBC: 5.21 10*3/uL (ref 3.50–10.80)

## 2016-12-06 LAB — HOMOCYSTEINE, SERUM: HOMOCYSTEINE: 7.1 umol/L (ref 5.08–15.39)

## 2016-12-06 LAB — GFR: EGFR: >60

## 2016-12-06 MED ORDER — CYANOCOBALAMIN 1000 MCG/ML IJ SOLN
1000.0000 ug | Freq: Once | INTRAMUSCULAR | Status: AC
Start: 2016-12-06 — End: 2016-12-06
  Administered 2016-12-06: 11:00:00 1000 ug via INTRAMUSCULAR
  Filled 2016-12-06: qty 1

## 2016-12-06 MED ORDER — VITAMIN B-12 500 MCG PO TABS
1000.0000 ug | ORAL_TABLET | Freq: Every day | ORAL | Status: DC
Start: 2016-12-07 — End: 2016-12-07
  Administered 2016-12-07: 10:00:00 1000 ug via ORAL
  Filled 2016-12-06: qty 2

## 2016-12-06 NOTE — Progress Notes (Signed)
12/06/16 1232   Respiratory Parameters - Non Ventilated   Status Completed   Vital capacity 1380 mL   $ Vital Capacity Performed? Yes   Negative inspiratory force -40 cm H2o   $ NIF DONE Yes   Performing Departments   PLAB VC performing department formula [id number]   Resp param - No vent performing department formula [id number]     NIF: -40 cmH2O  VC: 1380 mL.

## 2016-12-06 NOTE — Progress Notes (Signed)
[facility] Consultation Progress Note    Date Time: 12/06/16 11:10 AM  Patient Name: Meredith [name],Meredith [name]  Outpatient Neurologist :    CC: Weakness and paresthesias           Assessment:   This is a 21 y.o. female with a pmhx of ADHD who presents to the hospital 12/04/16 with bilateral arm and leg numbness and tingling, and weakness x 2 weeks.     1. Bilateral arm and leg numbness/paresthesias and weakness x 2 weeks (R>L). Exam inconsistent, strength testing appears to be effort dependent on exam. Etiology unclear. Low suspicion for a AIDP, myopathy, myleopathy or demyelinating disease. Possibly some psych component     -MRI/A brain, head/neck OSH 5/29 unremarkable  -MRI [name]/T spine OSH 6/1 unremarkable   -EMG 6/4 unremarkable   -TSH: 2.29, CRP <0.1, Sed rate: 9, vit b12 258      Patient Active Problem List   Diagnosis   . Bilateral leg and arm paresthesias   . Bilateral leg and arm weakness       Plan:   -EMG studies reviewed  -Hold off on LP as clinical picture not consistent with GBS or demyelinating disease  -Continue home medications  -Continue PT/OT  -Medical management per primary team  -Further recommendations per neurology attending     Attending note:   The patient was seen and examined by me. Interval history reviewed. All pertinent parts of the neurological exam were performed and confirmed by me, with any additional findings on exam noted in bold. I agree with the assessment and plan as outlined by the mid-level provider as above, with any additional considerations or recommendations as noted below.    Exam showed again inconsistent power in both upper and lower extremities.  Clinically there is no evidence for GBS.  EMG studies reviewed and discussed with the patient and family bedside.  At this time.  There is no clear etiology for her quadriparesis, considering her ENG normal and MRI unremarkable.  Recommend physical therapy, occupational therapy, this may take some time.  Will defer to physical therapy for a  deciding whether she needs outpatient physical therapy or acute rehabilitation.  Plan discussed in detail with the patient and family bedside.  Agreeable to the plan      Rosalio Macadamia, MD - [place]  NEUROLOGY  Available on XTEND paging  Board Certified in Neurology by ABPN  Board Certified Clinical Neurophysiology by ABPN         Interval History/Subjective:   Pt still endorses generalized weakness (BLE>BUE, R>L) and paresthesias, no true alleviating factors.   Denies HA, vision or speech disturbances, N/V.     Medications:     Current Facility-Administered Medications   Medication Dose Route Frequency   . cyanocobalamin  1,000 mcg Intramuscular Once   . enoxaparin  40 mg Subcutaneous Daily   . [START ON 12/07/2016] vitamin B-12  1,000 mcg Oral Daily       Review of Systems:   Negative except for what is mentioned in the interval/subjective section       Physical Exam:   Temp:  [96.6 F (35.9 [name])-98.4 F (36.9 [name])] 97.1 F (36.2 [name])  Heart Rate:  [85-92] 92  Resp Rate:  [13-16] 16  BP: (111-127)/(59-79) 111/59    Vital Signs:  Reviewed    General: The patient was well developed and well nourished.  No acute distress. Cooperative with the exam  Extremities: no pedal edema, extremities normal in color  Mental Status: The patient was awake, alert and oriented to person, place, and time.  Affect is normal  Fund of knowledge appropriate  Recent and remote memory are intact   Attention span and concentration appear normal.  Language function is normal. There is no evidence of aphasia in conversational speech. Follows commands     Cranial nerves:   -CN II: Visual fields full to bedside confrontation   -CN III, IV, VI: Pupils equal, round, and reactive to light; extraocular movements intact; no ptosis              -CN V: Facial sensation intact in V1 through V3 distributions   -CN VII: Face symmetric   -CN VIII: Hearing intact to conversational speech   -CN IX, X: Normal phonation   -CN XI: Symmetric full strength of  sternocleidomastoid and trapezius muscles   -CN XII: Tongue protrudes midline    Motor: Muscle tone normal without spasticity or flaccidity. No atrophy.  + RUE drift. BLE drift  Strength  R / L    R / L  Deltoid  4 / 4  Hip Flexion 4 / 4  Triceps  4 / 4   Hip extension 4 / 4  Biceps  4 / 4   Knee flexion 4 / 4  Wrist ext 4 / 4  Knee ext 4 / 4  Wrist flexion 4 / 4  Dorsiflexion 4 / 4  FF   4 / 4  Plantar flexion 4 / 4    Sensory:   Light touch diminished on the RUE/RLE.  Pinprick diminished on the RUE/RLE.Marland Kitchen  Temperature diminished on the RUE/RLE.Marland Kitchen    Reflexes:  R / L     R / L  Biceps  2 / 2  Knees  1 / 1  Triceps 2 / 2  Ankles  1 / 1  BR                   2 / 2  Plantars Equivcocal/ Equivocal    Coordination: FTN intact. No tremors    Gait: Deferred 2/2 pt safety concerns       Labs:     Results     Procedure Component Value Units Date/Time    Homocysteine, serum [[id number]] Collected:  12/06/16 0829    Specimen:  Blood Updated:  12/06/16 1100    Narrative:       Coll on Ice/Spin Immed    Methylmalonic acid, serum [[id number]] Collected:  12/06/16 0831    Specimen:  Blood Updated:  12/06/16 0841    Narrative:       Separate serum asap.    Gastric Parietal Cell AB [[id number]] Collected:  12/06/16 0831     Updated:  12/06/16 0841    Intrinsic factor blocking antibody [[id number]] Collected:  12/06/16 0831    Specimen:  Blood Updated:  12/06/16 0841    GFR [[id number]] Collected:  12/06/16 0504     Updated:  12/06/16 0540     EGFR >60.0    Basic Metabolic Panel [[id number]] Collected:  12/06/16 0504    Specimen:  Blood Updated:  12/06/16 0540     Glucose 90 mg/dL      BUN 06.2 mg/dL      Creatinine 0.7 mg/dL      Calcium 9.5 mg/dL      Sodium 376 mEq/L      Potassium 4.2 mEq/L      Chloride 107 mEq/L      CO2  23 mEq/L     CBC with differential [[id number]]  (Abnormal) Collected:  12/06/16 0504    Specimen:  Blood from Blood Updated:  12/06/16 0524     WBC 5.21 x10 3/uL      Hgb 13.2 g/dL      Hematocrit 40.9 %       Platelets 268 x10 3/uL      RBC 4.50 x10 6/uL      MCV 84.7 fL      MCH 29.3 pg      MCHC 34.6 g/dL      RDW 12 %      MPV 9.0 (L) fL      Neutrophils 51.4 %      Lymphocytes Automated 35.7 %      Monocytes 6.9 %      Eosinophils Automated 4.8 %      Basophils Automated 0.6 %      Immature Granulocyte 0.6 %      Nucleated RBC 0.0 /100 WBC      Neutrophils Absolute 2.68 x10 3/uL      Abs Lymph Automated 1.86 x10 3/uL      Abs Mono Automated 0.36 x10 3/uL      Abs Eos Automated 0.25 x10 3/uL      Absolute Baso Automated 0.03 x10 3/uL      Absolute Immature Granulocyte 0.03 x10 3/uL      Absolute NRBC 0.00 x10 3/uL           Rads:   No results found.    Signed by:  Santo Held, FNP-BC  Nurse Practitioner  [facility]  Spectra: [facility] [phone]  [facility] [phone]    *Final recommendations per attending neurologist who will also see patient today and record notes.

## 2016-12-06 NOTE — Plan of Care (Signed)
Problem: Safety  Goal: Patient will be free from injury during hospitalization  Outcome: Progressing      Problem: Pain  Goal: Pain at adequate level as identified by patient  Outcome: Progressing   12/06/16 0230   Goal/Interventions addressed this shift   Pain at adequate level as identified by patient Identify patient comfort function goal;Assess for risk of opioid induced respiratory depression, including snoring/sleep apnea. Alert healthcare team of risk factors identified.;Assess pain on admission, during daily assessment and/or before any "as needed" intervention(s);Reassess pain within 30-60 minutes of any procedure/intervention, per Pain Assessment, Intervention, Reassessment (AIR) Cycle;Evaluate if patient comfort function goal is met;Evaluate patient's satisfaction with pain management progress;Offer non-pharmacological pain management interventions;Include patient/patient care companion in decisions related to pain management as needed       Problem: Psychosocial and Spiritual Needs  Goal: Demonstrates ability to cope with hospitalization/illness  Outcome: Progressing   12/06/16 0230   Goal/Interventions addressed this shift   Demonstrates ability to cope with hospitalizations/illness Encourage verbalization of feelings/concerns/expectations;Provide quiet environment;Assist patient to identify own strengths and abilities;Encourage patient to set small goals for self;Encourage participation in diversional activity;Reinforce positive adaptation of new coping behaviors;Include patient/ patient care companion in decisions;Communicate referral to spiritual care as appropriate       Comments: Pt a/o x 4, complained of mild chest pain in afternoon, relieved by prn tylenol. Ambulating with walker, standby assist, ambulating in several times during shift. Tolerating diet well. States mild sob in afternoon, O2 sats WNL, RR 18, Dr. Delbert Phenix informed and came to bedside, no additional orders. Family at bedside in  evening, updated on plan of care.

## 2016-12-06 NOTE — Progress Notes (Signed)
[facility] HOSPITALIST PROGRESS NOTE    Date Time: 12/06/16 4:11 PM  Patient Name: Meredith [name]  Attending Physician: Les Pou, MD      History of Presenting Illness and Interval History/24 hour Events:   HPI per admitting MD:  "Meredith [name] is a 21 y.o. female hx ADHD, cholecystectomy, ovarian cyst removal who presents to the hospital with bilateral leg and arm paresthesias and weakness. This has been for 14 days. It started with right foot and lower leg paresthesias which went up to her right arm. She was admitted to [facility] 5/29 and had MRI brain / MRA head + neck which were unremarkable. She was referred to outpatient neurology (Dr. Odie Sera) but the next available appointment was in July. After she was hospitalized she developed left leg and arm paresthesias and weakness, difficulty walking. She had MRI C-spine and L-spine outpatient 6/1 which were unremarkable. Her symptoms worsened so her PMD referred her here for Guillain-Barre or multiple sclerosis evaluation. These symptoms are sudden onset, moderate intensity, without alleviating factors.     She noted having "stomach flu" with vomiting, diarrhea, and fever 3 weeks ago. She was told she had scoliosis on her imaging studies. "    6/4: No acute events last night.  Patient denied and the fever or chills diarrhea, but missed to the lower extremity weakness and her hand grips strength is decreased.  However, admits to some intermittent catching up of her breath.  6/5: No acute events. Patient this AM reports "heavy" breathing.  O2 sats and RR wnml.  Patient not in acute distress. Feels strength and sensation are unchanged since admission.      Physical Exam:     Vitals:    12/06/16 1544   BP: 111/71   Pulse: 89   Resp: 18   Temp: (!) 96.7 F (35.9 C)   SpO2: 96%       Intake and Output Summary (Last 24 hours) at Date Time    Intake/Output Summary (Last 24 hours) at 12/06/16 1611  Last data filed at 12/05/16 2144   Gross per 24 hour   Intake                 0 ml   Output              300 ml   Net             -300 ml       General: awake, alert, oriented x 3; no acute distress.  HEENT: perrla, eomi, sclera anicteric  oropharynx clear without lesions, mucous membranes moist  Neck: supple, no lymphadenopathy, no thyromegaly, no JVD, no carotid bruits  Cardiovascular: regular rate and rhythm, no murmurs, rubs or gallops  Lungs: clear to auscultation bilaterally, without wheezing, rhonchi, or rales  Abdomen: soft, non-tender, non-distended; no palpable masses, no hepatosplenomegaly, normoactive bowel sounds, no rebound or guarding  Extremities: no clubbing, cyanosis, or edema  Neuro: cranial nerves grossly intact, decreased bilateral hand grip strength, muscle strength in the lower extremities are 4- out of 5, sensation intact,   Skin: no rashes or lesions noted      Medications:     Current Facility-Administered Medications   Medication Dose Route Frequency   . enoxaparin  40 mg Subcutaneous Daily   . [START ON 12/07/2016] vitamin B-12  1,000 mcg Oral Daily         Labs:     Results     Procedure Component Value Units  Date/Time    Homocysteine, serum [[id number]] Collected:  12/06/16 0829    Specimen:  Blood Updated:  12/06/16 1133     HOMOCYSTEINE 7.10 umol/L     Methylmalonic acid, serum [[id number]] Collected:  12/06/16 0831    Specimen:  Blood Updated:  12/06/16 0841    Narrative:       Separate serum asap.    Gastric Parietal Cell AB [[id number]] Collected:  12/06/16 0831     Updated:  12/06/16 0841    Intrinsic factor blocking antibody [[id number]] Collected:  12/06/16 0831    Specimen:  Blood Updated:  12/06/16 0841    GFR [[id number]] Collected:  12/06/16 0504     Updated:  12/06/16 0540     EGFR >60.0    Basic Metabolic Panel [[id number]] Collected:  12/06/16 0504    Specimen:  Blood Updated:  12/06/16 0540     Glucose 90 mg/dL      BUN 47.4 mg/dL      Creatinine 0.7 mg/dL      Calcium 9.5 mg/dL      Sodium 259 mEq/L      Potassium 4.2 mEq/L      Chloride 107 mEq/L       CO2 23 mEq/L     CBC with differential [[id number]]  (Abnormal) Collected:  12/06/16 0504    Specimen:  Blood from Blood Updated:  12/06/16 0524     WBC 5.21 x10 3/uL      Hgb 13.2 g/dL      Hematocrit 32.9 %      Platelets 268 x10 3/uL      RBC 4.50 x10 6/uL      MCV 84.7 fL      MCH 29.3 pg      MCHC 34.6 g/dL      RDW 12 %      MPV 9.0 (L) fL      Neutrophils 51.4 %      Lymphocytes Automated 35.7 %      Monocytes 6.9 %      Eosinophils Automated 4.8 %      Basophils Automated 0.6 %      Immature Granulocyte 0.6 %      Nucleated RBC 0.0 /100 WBC      Neutrophils Absolute 2.68 x10 3/uL      Abs Lymph Automated 1.86 x10 3/uL      Abs Mono Automated 0.36 x10 3/uL      Abs Eos Automated 0.25 x10 3/uL      Absolute Baso Automated 0.03 x10 3/uL      Absolute Immature Granulocyte 0.03 x10 3/uL      Absolute NRBC 0.00 x10 3/uL             Radiology:     Radiology Results (24 Hour)     ** No results found for the last 24 hours. **          Lines:    Patient Lines/Drains/Airways Status    Active PICC Line / CVC Line / PIV Line / Drain / Airway / Intraosseous Line / Epidural Line / ART Line / Line / Wound / Pressure Ulcer / NG/OG Tube     Name:   Placement date:   Placement time:   Site:   Days:    Peripheral IV 12/04/16 Left Antecubital  12/04/16        Antecubital    1  Assessment:     Patient Active Problem List   Diagnosis   . Bilateral leg and arm paresthesias   . Bilateral leg and arm weakness       21 yo female hx ADHD, cholecystectomy, ovarian cyst removal who presents with bilateral leg and arm paresthesias and weakness.    Plan:     #Neuro: Bilateral leg and arm paresthesias and weakness.  Neurology notes inconsistent exam.   -MRI/A brain, head/neck OSH 5/29 unremarkable  -MRI C/T spine OSH 6/1 unremarkable   -EMG 6/4: wnml. not consistent with Guillain-Barre syndrome    -Neurology following  -Psych Consulted  -cw NIF and VC TID  -PT/OT    #Low Vit B12  -Replete IM then PO  -Check MMA,  Homocysteine, Gastric parietal, IF ab.    Hx ADHD, cholecystectomy, ovarian cyst removal - Noted.    DVT/GI prophylaxis - Lovenox, SCDs.     Anticipated discharge disposition and date:    Signed by: Les Pou, MD   WN:[id number], Purvis Sheffield, MD

## 2016-12-06 NOTE — UM Notes (Signed)
12/04/16 2234  Place (admit) on Observation Services (ADULT OBSERVATION ADMIT PANEL)        12/06/2016 CSR    Pt is in floor.    21 year old woman with both legs and arms weakness and numbness starting about 3 weeks ago. This gradually worsened.    VS: 111/59, 92, 16, 97.1, 96%      Neurology consult  This is an unremarkable study. There is no conclusive electrodiagnostic evidence of a generalized large fiber neuropathy, right cervical or lumbosacral radiculopathy or myopathy on this study.  If the patient's symptoms persist a repeat study could be done in a few weeks or sooner. Clinical and radiological correlation is recommended.    PT/OT on board  Recommends AR at D/C    Scheduled Meds:  Current Facility-Administered Medications   Medication Dose Route Frequency   . cyanocobalamin  1,000 mcg Intramuscular Once   . enoxaparin  40 mg Subcutaneous Daily   . [START ON 12/07/2016] vitamin B-12  1,000 mcg Oral Daily     Continuous Infusions:  PRN Meds:.acetaminophen, calcium carbonate, ondansetron **OR** ondansetron, senna-docusate     Amaryllis Dyke, RN MSN  Utilization Review  Case Management Department  [phone]  [facility]

## 2016-12-06 NOTE — Plan of Care (Signed)
Pt c/o SOB. O2 sats 97% on RA.

## 2016-12-06 NOTE — OT Progress Note (Signed)
Occupational Therapy Note      [facility]   Occupational Therapy Treatment     Patient: Meredith [name]   MRN#: [id number]  Unit: [facility] TOWER [address] EAST  Bed: F821/F821.01      Discharge Recommendations:   Discharge Recommendation: Acute Rehab  DME Recommended for Discharge:  (see alternative D/C rec)    If Acute Rehab recommended discharge disposition is not available, patient will need Min A for all functional mobility/transfers, RW, shower chair, and HHOT.    Assessment:   Pt continuing to report numbness and occasional feelings of "pins and needles" throughout BUEs and LEs. Pt continuing to demonstrate severely decreased strength throughout UEs and LEs. Pt demonstrating decreased FMC especially with opposition of 4th and 5th digits. With use of theraputty and therasponge pt completed hand strengthening exercises to improve coordination and dexterity. Provided pt with handout to ensure carryover of exercises. Pt able to don B socks seated at EOB with increased time to complete 2/2 decreased strength and coordination. Pt completed functional mobility and grooming standing at sink with CGA/Min A however requiring increased time and very unsteady 2/2 decreased strength and motor control. Pt with minimal dorsiflexion requiring cueing for increased hip flexion to avoid dragging feet on floor.  Pt highly motivated to regain function and would benefit from intensive therapy to increase independence. Pt continues to benefit from OT services to maximize independence and safety with ADLs and functional mobility.    Treatment Activities: ADL re-training, Functional mobility/transfer training, Pt education, There-ex    Educated the patient to role of occupational therapy, plan of care, goals of therapy and safety with mobility and ADLs.    Plan:   OT Frequency Recommended: 3-4x/wk    Continue plan of care.       Precautions and Contraindications:   Fall risk    Updated Medical  Status/Imaging/Labs:  Results from EMG reviewed     Subjective: "I want to know what is causing this"   Patient's medical condition is appropriate for Occupational Therapy intervention at this time.  Patient is agreeable to participation in the therapy session. Nursing clears patient for therapy.    Pain:   Scale: No report of pain  Location: N/A  Intervention: N/A    Objective:   Patient is in bed with peripheral IV in place.      Cognition  Arousal/Alertness: Alert  Orientation Level: A&Ox4  Memory: Intact  Safety Awareness: Intact  Problem Solving: Intact    Functional Mobility  Rolling:  SBA  Supine to Sit: CGA/Min A with increased time to complete  Sit to Stand: CGA with increased time to complete  Transfers: CGA with Rw; Min A without walker - increased time to complete, very unsteady, decreased hip flexion and dorsiflexion 2/2 decreased strength limiting clearance when taking steps    Balance  Static Sitting: Good  Dynamic Sitting: Good-/Fair+  Static Standing: Fair  Dynamic Standing: Fair-    Self Care and Home Management  Eating: Min A to open containers/cut 2/2 poor strength/dexterity  Grooming: CGA/Min A standing at sink  Bathing: CGA seated  UE Dressing: SBA  LE Dressing: CGA with increased time to complete  Toileting: SBA    Therapeutic Exercises  Completed hand strengthening exercises with theraputty/therasponge and UE/LE AROM exercises in prep for participation in ADLs.    Participation: Excellent  Endurance: Good    Patient left with call bell within reach, all needs met, SCDs off , fall mat  in place, bed alarm N/A, chair alarm on and all questions answered. RN notified of session outcome and patient response.     Goals:    Time For Goal Achievement: 7 visits  ADL Goals  Patient will groom self: Stand by Assist, at sinkside  Pt will complete bathing: Stand by Assist     Neuro Re-Ed Goals  Pt will perform dynamic standing balance: Stand by Assist, for 15 minutes, to complete standing ADLs  safely  Musculoskeletal Goals  Pt will perform fine motor coordination tasks: with supervision, feeding, grooming, w/ home exercise program, to increase ability to complete ADLs                   Time of Treatment  OT Received On: 12/06/16  Start Time: 3329  Stop Time: 1007  Time Calculation (min): 41 min    Treatment # 1 of 7 visits    Rosina Lowenstein, MS, OTR/L  Pager # [phone]

## 2016-12-06 NOTE — Progress Notes (Signed)
12/06/16 1700   Respiratory Parameters - Non Ventilated   Status Completed   Vital capacity 1550 mL   $ Vital Capacity Performed? Yes   Negative inspiratory force -40 cm H2o   $ NIF DONE Yes   Performing Departments   PLAB VC performing department formula [id number]   Resp param - No vent performing department formula [id number]     NIF: -40 cmH2O  VC:  1550 mL.

## 2016-12-06 NOTE — Plan of Care (Addendum)
Problem: Safety  Goal: Patient will be free from injury during hospitalization  Outcome: Progressing   12/06/16 0230   Goal/Interventions addressed this shift   Patient will be free from injury during hospitalization  Assess patient's risk for falls and implement fall prevention plan of care per policy;Provide and maintain safe environment;Use appropriate transfer methods;Ensure appropriate safety devices are available at the bedside;Hourly rounding;Include patient/ family/ care giver in decisions related to safety;Provide alternative method of communication if needed (communication boards, writing)     Goal: Patient will be free from infection during hospitalization  Outcome: Progressing   12/06/16 0230   Goal/Interventions addressed this shift   Free from Infection during hospitalization Assess and monitor for signs and symptoms of infection       Problem: Pain  Goal: Pain at adequate level as identified by patient  Outcome: Progressing   12/06/16 0230   Goal/Interventions addressed this shift   Pain at adequate level as identified by patient Identify patient comfort function goal;Assess for risk of opioid induced respiratory depression, including snoring/sleep apnea. Alert healthcare team of risk factors identified.;Assess pain on admission, during daily assessment and/or before any "as needed" intervention(s);Reassess pain within 30-60 minutes of any procedure/intervention, per Pain Assessment, Intervention, Reassessment (AIR) Cycle;Evaluate if patient comfort function goal is met;Evaluate patient's satisfaction with pain management progress;Offer non-pharmacological pain management interventions;Include patient/patient care companion in decisions related to pain management as needed       Problem: Side Effects from Pain Analgesia  Goal: Patient will experience minimal side effects of analgesic therapy  Outcome: Progressing   12/06/16 0230   Goal/Interventions addressed this shift   Patient will experience  minimal side effects of analgesic therapy Monitor/assess patient's respiratory status (RR depth, effort, breath sounds);Assess for changes in cognitive function;Prevent/manage side effects per LIP orders (i.e. nausea, vomiting, pruritus, constipation, urinary retention, etc.);Evaluate for opioid-induced sedation with appropriate assessment tool (i.e. POSS)       Problem: Discharge Barriers  Goal: Patient will be discharged home or other facility with appropriate resources  Outcome: Progressing   12/06/16 0230   Goal/Interventions addressed this shift   Discharge to home or other facility with appropriate resources Provide appropriate patient education;Initiate discharge planning       Problem: Psychosocial and Spiritual Needs  Goal: Demonstrates ability to cope with hospitalization/illness  Outcome: Progressing   12/06/16 0230   Goal/Interventions addressed this shift   Demonstrates ability to cope with hospitalizations/illness Encourage verbalization of feelings/concerns/expectations;Provide quiet environment;Assist patient to identify own strengths and abilities;Encourage patient to set small goals for self;Encourage participation in diversional activity;Reinforce positive adaptation of new coping behaviors;Include patient/ patient care companion in decisions;Communicate referral to spiritual care as appropriate       Problem: Moderate/High Fall Risk Score >5  Goal: Patient will remain free of falls  Outcome: Progressing   12/05/16 1300   OTHER   Moderate Risk (6-13) MOD-Use of chair-pad alarm when appropriate;MOD-Floor mat at bedside (where available) if appropriate;MOD-Apply bed exit alarm if patient is confused;MOD-Remain with patient during toileting   Pt walked around the unit before bedtime with supervision  Pt denies pain /nausea/vomiting  Pt has been tolerating fluid and voiding without any difficulty.  Pt has some numbness/tingling  to the extremities   Monitor VS/lab work
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
## 2016-12-06 NOTE — Progress Notes (Signed)
12/06/16 0905   Respiratory Parameters - Non Ventilated   Vital capacity (equip NA.)   Negative inspiratory force -52 cm H2o   $ NIF DONE Yes     NIF: -52.  [place] : NA due to equip.

## 2016-12-07 ENCOUNTER — Other Ambulatory Visit: Payer: Self-pay

## 2016-12-07 LAB — CBC AND DIFFERENTIAL
Absolute NRBC: 0 10*3/uL
Basophils Absolute Automated: 0.02 10*3/uL (ref 0.00–0.20)
Basophils Automated: 0.4 %
Eosinophils Absolute Automated: 0.27 10*3/uL (ref 0.00–0.70)
Eosinophils Automated: 5.5 %
Hematocrit: 37.1 % (ref 37.0–47.0)
Hgb: 12.9 g/dL (ref 12.0–16.0)
Immature Granulocytes Absolute: 0.02 10*3/uL
Immature Granulocytes: 0.4 %
Lymphocytes Absolute Automated: 1.98 10*3/uL (ref 0.50–4.40)
Lymphocytes Automated: 40.2 %
MCH: 29.4 pg (ref 28.0–32.0)
MCHC: 34.8 g/dL (ref 32.0–36.0)
MCV: 84.5 fL (ref 80.0–100.0)
MPV: 9.1 fL — ABNORMAL LOW (ref 9.4–12.3)
Monocytes Absolute Automated: 0.31 10*3/uL (ref 0.00–1.20)
Monocytes: 6.3 %
Neutrophils Absolute: 2.32 10*3/uL (ref 1.80–8.10)
Neutrophils: 47.2 %
Nucleated RBC: 0 /100 WBC (ref 0.0–1.0)
Platelets: 274 10*3/uL (ref 140–400)
RBC: 4.39 10*6/uL (ref 4.20–5.40)
RDW: 12 % (ref 12–15)
WBC: 4.92 10*3/uL (ref 3.50–10.80)

## 2016-12-07 LAB — BASIC METABOLIC PANEL
BUN: 13 mg/dL (ref 7.0–19.0)
CO2: 22 mEq/L (ref 22–29)
Calcium: 9.3 mg/dL (ref 8.5–10.5)
Chloride: 107 mEq/L (ref 100–111)
Creatinine: 0.7 mg/dL (ref 0.6–1.0)
Glucose: 86 mg/dL (ref 70–100)
Potassium: 4 mEq/L (ref 3.5–5.1)
Sodium: 138 mEq/L (ref 136–145)

## 2016-12-07 LAB — GFR: EGFR: >60

## 2016-12-07 MED ORDER — KETOROLAC TROMETHAMINE 30 MG/ML IJ SOLN
15.0000 mg | Freq: Four times a day (QID) | INTRAMUSCULAR | Status: DC | PRN
Start: 2016-12-07 — End: 2016-12-07
  Administered 2016-12-07 (×2): 15 mg via INTRAVENOUS
  Filled 2016-12-07 (×2): qty 1

## 2016-12-07 MED ORDER — CYANOCOBALAMIN 1000 MCG PO TABS
1000.0000 ug | ORAL_TABLET | Freq: Every day | ORAL | 0 refills | Status: AC
Start: 2016-12-08 — End: ?

## 2016-12-07 NOTE — Discharge Summary (Signed)
[facility] HOSPITALIST DISCHARGE SUMMARY    Date Time: 12/07/16 2:25 PM  Patient Name: Meredith [name]  Attending Physician: Les Pou, MDMD    Date of Admission:   12/04/2016    Date of Discharge:   12/07/2016    Reason for Admission:   Paresthesia [R20.2]  Gait disorder [R26.9]  Unsteady gait [R26.81]  Paresthesia [R20.2]    Problems:   Lists the present on admission hospital problems  Present on Admission:  . Bilateral leg and arm paresthesias  . Bilateral leg and arm weakness      Problem Lists:  Patient Active Problem List   Diagnosis   . Bilateral leg and arm paresthesias   . Bilateral leg and arm weakness       Discharge Dx:   Paresthesia [R20.2]  Gait disorder [R26.9]  Unsteady gait [R26.81]  Paresthesia [R20.2]    Consultations:   Treatment Team: Attending Provider: Les Pou, MD; Registered Nurse: Pearla Dubonnet, RN; Registered Nurse: Scheryl Marten, RN; Respiratory Care Practitioner: Leonides Grills, RT; Registered Nurse: Stann Mainland, RN; Case Manager: Robie Ridge    Procedures performed:     No orders to display       Presenting history and hospital Course:   HPI per admitting provider  ""Meredith [name] a 21 y.o.femalehx ADHD, cholecystectomy, ovarian cyst removal who presents to the hospital with bilateral leg and arm paresthesias and weakness. This has been for 14days. It started with right foot and lower leg paresthesias which went up to her right arm. [facility] brain / MRA head + neck which wereunremarkable. She was referred to outpatient neurology (Dr. Odie Sera) but the next available appointment was in July. After she was hospitalized she developed left leg and arm paresthesias and weakness, difficulty walking. She had MRI C-spine and L-spine outpatient 6/1 which were unremarkable. Her symptoms worsened so her PMD referred her here for Guillain-Barre or multiple sclerosis evaluation. These symptoms are sudden onset, moderate intensity, without  alleviating factors.     She noted having "stomach flu" with vomiting, diarrhea, and fever 3 weeks ago. She was told she had scoliosis on her imaging studies. "    6/4: No acute events last night.  Patient denied and the fever or chills diarrhea, but missed to the lower extremity weakness and her hand grips strength is decreased.  However, admits to some intermittent catching up of her breath.  6/5: No acute events. Patient this AM reports "heavy" breathing.  O2 sats and RR wnml.  Patient not in acute distress. Feels strength and sensation are unchanged since admission.    "      HOSPITAL COURSE  21 yo female hx ADHD, cholecystectomy, ovarian cyst removal who presents with bilateral leg and arm paresthesias and weakness.      #Neuro: Bilateral leg and arm paresthesias and weakness x 2 weeks. Patient was evaluted by Neurology and felt to have inconsistent exam, with effort dependent exam.  Etiology of symptoms remains unclear patient with previous hospital visits to OSH with negative workup:   -MRI/A brain, head/neck OSH 5/29 unremarkable  -MRI C/T spine OSH 6/1 unremarkable   -EMG 6/4: wnml. not consistent with Guillain-Barre syndrome    Per Neurology no indication for LP and etiology of symptoms not felt to be rekated to GBS. Patient evaluate by Psychiatry as well to evaluate for possilbe conversion disorder.      #Low Vit B12: Replete IM then PO  -Check MMA, Homocysteine,  Gastric parietal, IF ab. Pending at ime of Discharge.      Hx ADHD, cholecystectomy, ovarian cyst removal - Noted.    Disposition: Home with outpatient PT    Physical exam at discharge:  Vitals:    12/07/16 0049   BP:    Pulse:    Resp: 16   Temp:    SpO2:      General: awake, alert, oriented x 3; no acute distress.  HEENT: NC/AT, mucous membranes moist  Neck: supple, Cardiovascular: regular rate and rhythm, no murmurs, rubs or gallops  Lungs: clear to auscultation bilaterally, without wheezing, rhonchi, or rales  Abdomen: soft, non-tender,  non-distended; normoactive bowel sounds, no rebound or guarding  Extremities: no clubbing, cyanosis, or edema  Neuro: cranial nerves grossly intact, decreased bilateral hand grip strength, muscle strength in the lower extremities are 4- out of 5, sensation equal but decreased in b/l LE to light touch,   Skin: no rashes or lesions noted    Discharge Medications:        Medication List      START taking these medications    cyanocobalamin 1000 MCG tablet  Take 1 tablet (1,000 mcg total) by mouth daily.  Start taking on:  12/08/2016        CONTINUE taking these medications    norelgestromin-ethinyl estradiol 150-35 MCG/24HR  Commonly known as:  ORTHO EVRA        STOP taking these medications    ADDERALL PO           Where to Get Your Medications      You can get these medications from any pharmacy    Bring a paper prescription for each of these medications   cyanocobalamin 1000 MCG tablet          Discharge Instructions:   Bubba Camp, MD  [address]  [place] [address]  [phone]    Schedule an appointment as soon as possible for a visit in 2 day(s)  If symptoms worsen, return to ED    Neurology    Follow up  Please establish care with a Neurologist when you return home.          TIME SPENT:   On discharge and care coordination is 45 minutes.    Signed by: Les Pou, MD    CC to: Bubba Camp, MD

## 2016-12-07 NOTE — Progress Notes (Signed)
Pt given instructions. IV removed, intact and walker given to patient. Transported off unit in wheelchair.

## 2016-12-07 NOTE — PT Progress Note (Signed)
Physical Therapy Note    [facility]   Physical Therapy Treatment  Patient:  Meredith [name] MRN#:  [id number]  Unit: [facility] TOWER 8 EAST  Bed: F821/F821.01    Discharge Recommendations:   D/C Recommendations: Home with outpatient PT   DME Recommendations: RW           Assessment:   Pt able to safely amb with RW on levels with dec DF, knee flexion and hip flexion. Pt able to lift LEs higher with verbal cues to lift LEs using hip muscles. Pt able to ascend 3 steps with rail and min a, step to(pt able to lift LE to next step w/o difficulty. Recommend Outpt PT.Marland Kitchen  Pt continues to benefit from skilled PT to maximize functional independence.  Patient left without needs and call bell within reach. RN notified of session outcome.    Treatment Activities: bed mobility, transfer/amb/stair training, neuro re-ed exs    Educated the patient to role of physical therapy, plan of care, goals of therapy and HEP, safety with mobility and ADLs.    Plan:   PT Frequency: 3-4x/wk    Continue plan of care.       Precautions and Contraindications:   bil LE weakness    Updated Medical Status/Imaging/Labs: na    Subjective:   Patient's medical condition is appropriate for Physical Therapy intervention at this time.  Patient is agreeable to participation in the therapy session.  "My legs are numb, my dorsiflexors don't work."    Pain:   Scale: 6/10  Location: back  Intervention: rn aware      Objective:   Patient received in bed with iv in place.    Cognition  o x 3    Functional Mobility  Rolling: nt  Supine to Sit: ind  Scooting: sba  Sit to Stand: cg  Stand to Sit: cg  Transfers: cg to sba with RW    Ambulation  PMP - Progressive Mobility Protocol   PMP Activity: Step 7 - Walks out of Room  Distance Walked (ft) (Step 6,7): 100 Feet   Level of Assistance required: cg to sba  Pattern: dec DF, knee flexion, hip flexion(but with v cues pt able to lift LE from hip, slow cadence  Device Used: Rw  Weightbearing Status:  FWB  Stair Management: up/down 3 steps with rail, min a, step to          Balance  Good with RW    Therapeutic Exercises  UE/LE exs    Patient Participation: good  Patient Endurance: good    Patient left with call bell within reach, all needs met, SCDs not on when entered room, fall mat in place, bed alarm na, chair alarm na and all questions answered. RN notified of session outcome and patient response.     Goals:  Goals  Goal Formulation: With patient  Time for Goal Acheivement: 3 visits  Goals: Select goal  Pt Will Transfer Bed/Chair: with stand by assist  Pt Will Ambulate: 101-150 feet, with rolling walker, with stand by assist  Pt Will Go Up / Down Stairs: 3-5 stairs, with minimal assist, With rail      [place], PT, [phone]    Time of Treatment:  PT Received On: 12/07/16  Start Time: 1340  Stop Time: 1410  Time Calculation (min): 30 min  Treatment # 1out of 3 visits

## 2016-12-07 NOTE — Discharge Instr - AVS First Page (Addendum)
Reason for your Hospital Admission:  Paresthesia    Instructions for after your discharge:  Follow up and do outpatient physical therapy in [place]  Follow up with primary care 1-2 days    Home Health Discharge Information     [employer]:  Name of DME Agency: [facility] ([id number])]  Equipment Ordered:  Levan Hurst     The above services were set up by:  Marigene Ehlers  [facility] Liaison)

## 2016-12-07 NOTE — Plan of Care (Signed)
Problem: Safety  Goal: Patient will be free from injury during hospitalization   12/07/16 1644   Goal/Interventions addressed this shift   Patient will be free from injury during hospitalization  Assess patient's risk for falls and implement fall prevention plan of care per policy;Provide and maintain safe environment;Use appropriate transfer methods;Ensure appropriate safety devices are available at the bedside;Include patient/ family/ care giver in decisions related to safety;Hourly rounding       Problem: Pain  Goal: Pain at adequate level as identified by patient   12/07/16 1644   Goal/Interventions addressed this shift   Pain at adequate level as identified by patient Identify patient comfort function goal;Assess for risk of opioid induced respiratory depression, including snoring/sleep apnea. Alert healthcare team of risk factors identified.;Assess pain on admission, during daily assessment and/or before any "as needed" intervention(s);Reassess pain within 30-60 minutes of any procedure/intervention, per Pain Assessment, Intervention, Reassessment (AIR) Cycle;Evaluate if patient comfort function goal is met;Evaluate patient's satisfaction with pain management progress       Problem: Discharge Barriers  Goal: Patient will be discharged home or other facility with appropriate resources   12/07/16 1644   Goal/Interventions addressed this shift   Discharge to home or other facility with appropriate resources Provide appropriate patient education;Provide information on available health resources;Initiate discharge planning       Comments: Pt AOX4.  VSS.  Tolerated diet with no nausea or emesis.  Toradol administered for pain and it was effective.  Call bell within reach, floor mat in place and pt aware to call for help if assistance required.  Discharge summary explained and copies provided.  IV removed and wheal chair requested.  Walker delivered to room.

## 2016-12-07 NOTE — Progress Notes (Signed)
Home Health Referral          Referral from Dion Saucier (Case Manager) for home health care upon discharge.    By [employer], the patient has the right to freely choose a home care provider.  Arrangements have been made with:     A company of the patients choosing. We have supplied the patient with a listing of providers in your area who asked to be included and participate in Medicare.   [facility], a home care agency that provides both adult home care services which is a wholly owned and operated by [employer] and participates in [employer]   The preferred provider of your insurance company. Choosing a home care provider other than your insurance company's preferred provider may affect your insurance coverage.    The Home Health Care Referral Form acknowledging the voluntary selection of the home care company has been completed, signed, and is on file.      Home Health Discharge Information     [employer]:  Name of DME Agency: [facility] [phone])]  Equipment Ordered:  Levan Hurst     The above services were set up by:  Marigene Ehlers  [facility] Liaison)   Phone   [phone]    Signed by: Marigene Ehlers  Date Time: 12/07/16 3:57 PM

## 2016-12-07 NOTE — UM Notes (Signed)
12/04/16 2234  Place (admit) on Observation Services (ADULT OBSERVATION ADMIT PANEL)        12/07/2016 CSR    Summary:21 y.o. female with a pmhx of ADHD who presents to the hospital 12/04/16 with bilateral arm and leg numbness and tingling, and weakness x 2 weeks.     VSS    Neurology note on 6/5  Exam showed again inconsistent power in both upper and lower extremities.  Clinically there is no evidence for GBS.  EMG studies reviewed and discussed with the patient and family bedside.  At this time.  There is no clear etiology for her quadriparesis, considering her ENG normal and MRI unremarkable.  Recommend physical therapy, occupational therapy, this may take some time.  Will defer to physical therapy for a deciding whether she needs outpatient physical therapy or acute rehabilitation.  Plan discussed in detail with the patient and family bedside.  Agreeable to the plan    Medicine plan on 6/5  #Neuro: Bilateral leg and arm paresthesias and weakness.  Neurology notes inconsistent exam.   -MRI/A brain, head/neck OSH 5/29 unremarkable  -MRI C/T spine OSH 6/1 unremarkable   -EMG 6/4: wnml. not consistent with Guillain-Barre syndrome  -Neurology following  -Psych Consulted  -cw NIF and VC TID  -PT/OT  #Low Vit B12  -Replete IM then PO  -Check MMA, Homocysteine, Gastric parietal, IF ab.  Hx ADHD, cholecystectomy, ovarian cyst removal - Noted.  DVT/GI prophylaxis - Lovenox, SCDs.     Psych consult  Patient will require a full workup to rule out any medical causes of her bilateral weakness, numbness, and paresthesias, if other medical sources have been ruled out then this the cause of these symptoms would most likely be due to conversion disorder and would require on going treatment from psychiatry and therapy.     Scheduled Meds:  Current Facility-Administered Medications   Medication Dose Route Frequency   . enoxaparin  40 mg Subcutaneous Daily   . vitamin B-12  1,000 mcg Oral Daily     Continuous Infusions:  PRN  Meds:.acetaminophen, calcium carbonate, ketorolac, ondansetron **OR** ondansetron, senna-docusate     Meredith Dyke, RN MSN  Utilization Review  Case Management Department  [phone]  [facility]

## 2016-12-07 NOTE — Progress Notes (Signed)
[facility] ([phone])  Rolling Walker delivered to patients room

## 2016-12-07 NOTE — Plan of Care (Addendum)
Problem: Safety  Goal: Patient will be free from injury during hospitalization  Outcome: Progressing   12/07/16 0149   Goal/Interventions addressed this shift   Patient will be free from injury during hospitalization  Assess patient's risk for falls and implement fall prevention plan of care per policy;Provide and maintain safe environment;Use appropriate transfer methods;Ensure appropriate safety devices are available at the bedside;Hourly rounding;Include patient/ family/ care giver in decisions related to safety;Provide alternative method of communication if needed (communication boards, writing)     Goal: Patient will be free from infection during hospitalization  Outcome: Progressing   12/06/16 0230   Goal/Interventions addressed this shift   Free from Infection during hospitalization Assess and monitor for signs and symptoms of infection       Problem: Pain  Goal: Pain at adequate level as identified by patient  Outcome: Progressing   12/07/16 0149   Goal/Interventions addressed this shift   Pain at adequate level as identified by patient Identify patient comfort function goal;Assess for risk of opioid induced respiratory depression, including snoring/sleep apnea. Alert healthcare team of risk factors identified.;Assess pain on admission, during daily assessment and/or before any "as needed" intervention(s);Reassess pain within 30-60 minutes of any procedure/intervention, per Pain Assessment, Intervention, Reassessment (AIR) Cycle;Evaluate if patient comfort function goal is met;Evaluate patient's satisfaction with pain management progress;Offer non-pharmacological pain management interventions;Include patient/patient care companion in decisions related to pain management as needed       Problem: Side Effects from Pain Analgesia  Goal: Patient will experience minimal side effects of analgesic therapy  Outcome: Progressing   12/07/16 0149   Goal/Interventions addressed this shift   Patient will experience  minimal side effects of analgesic therapy Monitor/assess patient's respiratory status (RR depth, effort, breath sounds);Assess for changes in cognitive function;Prevent/manage side effects per LIP orders (i.e. nausea, vomiting, pruritus, constipation, urinary retention, etc.);Evaluate for opioid-induced sedation with appropriate assessment tool (i.e. POSS)       Problem: Discharge Barriers  Goal: Patient will be discharged home or other facility with appropriate resources  Outcome: Progressing   12/07/16 0149   Goal/Interventions addressed this shift   Discharge to home or other facility with appropriate resources Provide appropriate patient education;Initiate discharge planning       Problem: Psychosocial and Spiritual Needs  Goal: Demonstrates ability to cope with hospitalization/illness  Outcome: Progressing   12/07/16 0149   Goal/Interventions addressed this shift   Demonstrates ability to cope with hospitalizations/illness Encourage verbalization of feelings/concerns/expectations;Provide quiet environment;Assist patient to identify own strengths and abilities;Encourage patient to set small goals for self;Encourage participation in diversional activity;Reinforce positive adaptation of new coping behaviors;Communicate referral to spiritual care as appropriate;Include patient/ patient care companion in decisions       Problem: Moderate/High Fall Risk Score >5  Goal: Patient will remain free of falls  Outcome: Progressing   12/06/16 2019   OTHER   Moderate Risk (6-13) MOD-Floor mat at bedside (where available) if appropriate     Pt walked around the unit before bedtime with supervision  Pt denies nausea/vomiting  Pt has been tolerating fluid and voiding without any difficulty.  Pt has some numbness/tingling  to the extremities   Monitor VS/lab work  Pt complained of pain to the back  and was given Toradol  Given with some relief, will continue to monitor

## 2016-12-07 NOTE — Progress Notes (Signed)
[facility] Inpatient Rehab Note:  Referral received. Thank you.   [facility] is unable to accept patient.  The pt does not meet criteria for acute rehab as there is no supporting evidence for a rehab diagnosis at this time. Reviewed with rehab admissions manager, Magnus Sinning RN.         Let message for Dion Saucier this AM.   Lajuan Lines BSN, RN, ACM/ Minta Balsam, RN-BC, CRRN  [phone]  [facility]-Rehab Admissions Liaison

## 2016-12-07 NOTE — Progress Notes (Signed)
[facility] Neurology Consultation Progress Note    Date Time: 12/07/16 12:37 PM  Patient Name: Manetta,Maleena C  Outpatient Neurologist :    CC: Weakness and paresthesias           Assessment:   This is a 21 y.o. female with a pmhx of ADHD who presents to the hospital 12/04/16 with bilateral arm and leg numbness and tingling, and weakness x 2 weeks.     1. Bilateral arm and leg numbness/paresthesias and weakness x 2 weeks (R>L). Exam inconsistent, strength testing appears to be effort dependent on exam. Etiology unclear. Low suspicion for a AIDP, myopathy, myleopathy or demyelinating disease. Possibly conversion disorder     -MRI/A brain, head/neck OSH 5/29 unremarkable  -MRI C/T spine OSH 6/1 unremarkable   -EMG 6/4 unremarkable   -TSH: 2.29, CRP <0.1, Sed rate: 9, vit b12 258, homocysteine 7.10      Patient Active Problem List   Diagnosis   . Bilateral leg and arm paresthesias   . Bilateral leg and arm weakness       Plan:   -Continue home medications  -EMG studies reviewed  -No clinical indication for LP   -Medical management per primary team  -Continue PT/OT  -Further recommendations per neurology attending     Attending note:   The patient was seen and examined by me. Interval history reviewed. All pertinent parts of the neurological exam were performed and confirmed by me, with any additional findings on exam noted in bold. I agree with the assessment and plan as outlined by the mid-level provider as above, with any additional considerations or recommendations as noted below.    No worsening of weakness.  C/o poor sensation over the legs.  Etiology unlikely GBS, but need acute rehab for further improvement: defer to PT for further recommendations.   D/w family and patient: who [place] likely [place] .  Plan updated with the PT team    Rosalio Macadamia, MD - [place]  NEUROLOGY  Available on XTEND paging  Board Certified in Neurology by ABPN  Board Certified Clinical Neurophysiology by ABPN         Interval  History/Subjective:   No acute neurological events reported or documented overnight.  Patient is awake, alert, answering questions and following commands appropriately.  She reports that she feels like her strength has not changed since she came into the hospital.  She still endorses generalized weakness (BLE>BUE, R>L) and paresthesias, no true alleviating factors.   Patient denies HA, dizziness, n/v, vision or speech disturbances.     Medications:     Current Facility-Administered Medications   Medication Dose Route Frequency   . enoxaparin  40 mg Subcutaneous Daily   . vitamin B-12  1,000 mcg Oral Daily       Review of Systems:   A comprehensive review of systems was negative except as documented in the above interval history.    Physical Exam:   Temp:  [96.7 F (35.9 C)-98.5 F (36.9 C)] 98.5 F (36.9 C)  Heart Rate:  [89-90] 90  Resp Rate:  [16-18] 16  BP: (111-120)/(70-71) 120/70    Vital Signs:  Reviewed    General: The patient was well developed and well nourished.  No acute distress. Cooperative with the exam  Extremities: no pedal edema, extremities normal in color    Mental Status: The patient was awake, alert and oriented to person, place, and time.  Affect is normal  Fund of knowledge appropriate  Recent and  remote memory are intact   Attention span and concentration appear normal.  Language function is normal. There is no evidence of aphasia in conversational speech. Follows commands     Cranial nerves:   -CN II: Visual fields full to bedside confrontation   -CN III, IV, VI: Pupils equal, round, and reactive to light; extraocular movements intact; no ptosis              -CN V: Facial sensation intact in V1 through V3 distributions   -CN VII: Face symmetric   -CN VIII: Hearing intact to conversational speech   -CN IX, X: Normal phonation   -CN XI: Symmetric full strength of sternocleidomastoid and trapezius muscles   -CN XII: Tongue protrudes midline    Motor: Muscle tone normal without spasticity or  flaccidity. No atrophy.  + RUE drift. BLE drift  Strength  R / L    R / L  Deltoid  4 / 4  Hip Flexion 4 / 4  Triceps  4 / 4   Hip extension 4 / 4  Biceps  4 / 4   Knee flexion 4 / 4  Wrist ext 4 / 4  Knee ext 4 / 4  Wrist flexion 4 / 4  Dorsiflexion 4 / 4  FF   4 / 4  Plantar flexion 4 / 4    Sensory:   Light touch diminished on the RUE/RLE.  Pinprick diminished on the RUE/RLE.  Temperature diminished on the RUE/RLE.    Reflexes:  R / L     R / L  Biceps  2 / 2  Knees  1 / 1  Triceps 2 / 2  Ankles  1 / 1  BR                   2 / 2  Plantars Mute/ Mute    Coordination: FTN intact. No tremors    Gait: Deferred 2/2 pt safety concerns       Labs:     Results     Procedure Component Value Units Date/Time    Intrinsic factor blocking antibody [[id number]] Collected:  12/06/16 0831    Specimen:  Blood Updated:  12/07/16 1153     Intrinsic Factor Block Ab Negative    Basic Metabolic Panel [[id number]] Collected:  12/07/16 0502    Specimen:  Blood Updated:  12/07/16 0601     Glucose 86 mg/dL      BUN 62.1 mg/dL      Creatinine 0.7 mg/dL      Calcium 9.3 mg/dL      Sodium 308 mEq/L      Potassium 4.0 mEq/L      Chloride 107 mEq/L      CO2 22 mEq/L     GFR [[id number]] Collected:  12/07/16 0502     Updated:  12/07/16 0601     EGFR >60.0    CBC with differential [[id number]]  (Abnormal) Collected:  12/07/16 0502    Specimen:  Blood from Blood Updated:  12/07/16 0530     WBC 4.92 x10 3/uL      Hgb 12.9 g/dL      Hematocrit 40.1 %      Platelets 274 x10 3/uL      RBC 4.39 x10 6/uL      MCV 84.5 fL      MCH 29.4 pg      MCHC 34.8 g/dL      RDW 12 %  MPV 9.1 (L) fL      Neutrophils 47.2 %      Lymphocytes Automated 40.2 %      Monocytes 6.3 %      Eosinophils Automated 5.5 %      Basophils Automated 0.4 %      Immature Granulocyte 0.4 %      Nucleated RBC 0.0 /100 WBC      Neutrophils Absolute 2.32 x10 3/uL      Abs Lymph Automated 1.98 x10 3/uL      Abs Mono Automated 0.31 x10 3/uL      Abs Eos Automated 0.27 x10 3/uL       Absolute Baso Automated 0.02 x10 3/uL      Absolute Immature Granulocyte 0.02 x10 3/uL      Absolute NRBC 0.00 x10 3/uL           Rads:   No results found.    Signed by:  Norvel Richards. Clemon Chambers, FNP-BC  Nurse Practitioner  [facility]  Pager: [phone]  Spectralink [phone]  After Hours: [phone]    Please see attending Neurologist note that accompanies this mid-level encounter note.

## 2016-12-07 NOTE — Progress Notes (Signed)
12/07/16 0049   Respiratory Parameters - Non Ventilated   Status Completed   Resp Rate 16   Vital capacity 1900 mL   $ Vital Capacity Performed? Yes   Negative inspiratory force -42 cm H2o   $ NIF DONE Yes   Equipment labeled Yes   Adverse Reactions None   Performing Departments   PLAB VC performing department formula [id number]   Resp param - No vent performing department formula [id number]

## 2016-12-07 NOTE — Discharge Instructions (Signed)
Dear Ms. Studley:    Thank you for choosing the [facility] Emergency Department, the premier emergency department in the [place] area.  I hope your visit today was EXCELLENT.    Specific instructions for your visit today:    Please follow up with your primary care provider in 1-2 days.    Paresthesias    You have been seen for paresthesias.    Paresthesia is an abnormal sensation (feeling) in any part of the body. The paresthesia itself has no long-term bad effects. People often describe it as tingling, numbness, burning, or pricking of the skin. Many say it feels like "pins and needles," or like the body part is asleep.    Paresthesias can be a symptom of some illnesses. This means there are many things that can cause paresthesias. The paresthesias can be a sign of an underlying medical condition.     Some causes of paresthesias are:   Skin Problems: Irritation of skin by certain chemicals. Swelling of the skin from an injury. A burn or frostbite can feel like numbness.   Pressure on a nerve. This can happen when your arm "falls asleep" from lying on it too long. Carpal tunnel syndrome can do the same thing.   Hyperventilation (rapid or deep breathing).   Deficiency in some vitamins and minerals. This includes vitamins B1, B5, and B12.   Electrolyte problems.   Diabetic neuropathy (nerve disorders) from long-standing diabetes.   Problems with circulation.   Strokes.    You may have had some testing to help find out the cause of your paresthesias.    We still do not know the cause of your paresthesias. However, it is OK for you to go home. You may need more tests to figure out the cause.    See your primary care doctor or the referral specialist for more work-up and management of your paresthesias.    YOU SHOULD SEEK MEDICAL ATTENTION IMMEDIATELY, EITHER HERE OR AT THE NEAREST EMERGENCY DEPARTMENT, IF ANY OF THE FOLLOWING OCCUR:   Your arms get weak, numb or paralyzed (can't move), especially  on one side.   You have vision loss, trouble speaking or problems thinking.   Your speech is abnormal or one side of your face droops.   You lose consciousness ("pass out") or almost lose consciousness.   You have numbness or tingling after a head, neck or back injury.   You feel very dizzy or like the room is spinning.   You have other concerns.               Anxiety, Panic    You have been diagnosed with an anxiety attack.    You seem to have had an anxiety attack. There are many conditions that can cause symptoms like these. If this is the first time this has happened, follow-up with your regular doctor. You may need more testing to be sure there isn't another cause for your symptoms.    Anxiety causes very strong feelings of worry and fear. It may also cause chest pain or shortness of breath. You may feel like you have palpitations (a racing heart). You might feel numbness (like parts of your body are "asleep"), especially around the mouth and in the hands or feet.    Follow up with your counselor and family doctor. If you do not have an appointment in the next 2-3 days, call and make one. It is VERY IMPORTANT for your counselor and family doctor to  know if you get worse.    YOU SHOULD SEEK MEDICAL ATTENTION IMMEDIATELY, EITHER HERE OR AT THE NEAREST EMERGENCY DEPARTMENT, IF ANY OF THE FOLLOWING OCCURS:   You have symptoms that you normally don't have with your anxiety attacks.   You think of harming yourself (suicidal thoughts) or harming someone else.   You have symptoms you normally don't have and they last longer than normal or your medicine doesn't help. These include chest pain, passing out, feeling that your heart is racing or shortness of breath.   You have a fever (temperature higher than 100.65F / 38C).               Generalized Anxiety Disorder (GAD)    You were seen for Generalized Anxiety Disorder (GAD).    Generalized Anxiety Disorder happens when someone worries too much about  daily life without having a clear reason why. The anxiety can be caused by normal, everyday things even when there is little or no cause for worry. The person can be very anxious about just getting through the day.     GAD often starts when people are teens or young adults. Sometimes this problem is hard to diagnose because people with GAD may not have specific complaints when they see the doctor. This can make it hard to figure out exactly what is going on and to make the right diagnosis.     Vague complaints can include:     Problems focusing.   Feeling tired.   Having trouble sleeping or feeling restless.   Being startled easily (jumpy).   Feeling worried all the time.    Often people worry so much that they can't have a normal relationship, do their daily activities or do well at work. They often worry all day long. This often happens when people are under a lot of stress. Generalized Anxiety is different from Panic Attacks. Usually, panic attacks start suddenly and then go away fairly quickly. In between panic attacks, the person can feel normal.    Most of the time, a psychiatrist or primary care doctor can treat Generalized Anxiety Disorder. The doctor you saw today feels this is the best plan for you.    Sometimes having anxiety can lead to serious problems. Some people feel very depressed or like hurting themselves. We don't believe your condition is dangerous right now. However, you need to be careful. Sometimes a problem that seems small can get serious later.     Some things that can be tried are:     Anxiety support groups.   Antidepressant medications.   Individual therapy.    YOU SHOULD SEEK MEDICAL ATTENTION IMMEDIATELY, EITHER HERE OR AT THE NEAREST EMERGENCY DEPARTMENT, IF ANY OF THE FOLLOWING OCCUR:     You feel like hurting yourself or someone else.   You notice your heart is racing and can't explain why.    If you develop chest pain.   You are abusing alcohol or any other  drugs.   You have trouble with your follow-up or have any other concerns.    The number for the Suicide Prevention Hotline is 1-800-SUICIDE 613-542-5116) or 1-800-273-TALK (8255).    If you can't follow up with your doctor, or if at any time you feel you need to be rechecked or seen again, come back here or go to the nearest emergency department.         If you do not continue to improve or your condition worsens, please contact  your doctor or return immediately to the Emergency Department.      OBTAINING A PRIMARY CARE APPOINTMENT    Primary care physicians (PCPs, also known as primary care doctors) are either internists or family medicine doctors. Both types of PCPs focus on health promotion, disease prevention, patient education and counseling, and treatment of acute and chronic medical conditions.    Call for an appointment with a primary care doctor.  Ask to see who is taking new patients.     [facility]  telephone:  [phone]  [email or web address]    DOCTOR REFERRALS  Call [phone] (available 24 hours a day, 7 days a week) if you need any further referrals and we can help you find a primary care doctor or specialist.  Also, available online at:  [email or web address]    YOUR CONTACT INFORMATION  Before leaving please check with registration to make sure we have an up-to-date contact number.  You can call registration at [phone] to update your information.  For questions about your hospital bill, please call [phone].  For questions about your Emergency Dept Physician bill please call [phone].      FREE HEALTH SERVICES  If you need help with health or social services, please call 2-1-1 for a free referral to resources in your area.  2-1-1 is a free service connecting people with information on health insurance, free clinics, pregnancy, mental health, dental care, food assistance, housing, and substance abuse counseling.  Also, available online at:   http://www.[facility].org    MEDICAL RECORDS AND TESTS  Certain laboratory test results do not come back the same day, for example urine cultures.   We will contact you if other important findings are noted.  Radiology films are often reviewed again to ensure accuracy.  If there is any discrepancy, we will notify you.      Please call [phone] to pick up a complimentary CD of any radiology studies performed.  If you or your doctor would like to request a copy of your medical records, please call [phone].      ORTHOPEDIC INJURY   Please know that significant injuries can exist even when an initial x-ray is read as normal or negative.  This can occur because some fractures (broken bones) are not initially visible on x-rays.  For this reason, close outpatient follow-up with your primary care doctor or bone specialist (orthopedist) is required.    MEDICATIONS AND FOLLOWUP  Please be aware that some prescription medications can cause drowsiness.  Use caution when driving or operating machinery.    The examination and treatment you have received in our Emergency Department is provided on an emergency basis, and is not intended to be a substitute for your primary care physician.  It is important that your doctor checks you again and that you report any new or remaining problems at that time.      24 HOUR PHARMACIES  The nearest 24 hour pharmacy is:    [facility]  [address]  [place], [place] [address]  [phone]      ASSISTANCE WITH INSURANCE    Affordable Care Act  [facility])  Call to start or finish an application, compare plans, enroll or ask a question.  [phone]  TTY: 4236331431  Web:  Healthcare.gov    Help Enrolling in [facility]  [employer] [place]  (423)226-4814 (TOLL-FREE)  769-664-4700 (TTY)  Web:  Http://www.coverva.org    Local Help Enrolling in the [employer]  [place] [facility]  (  571) C1131384 (MAIN)  Email:  health-help@nvfs .org  Web:   [employer]jackMyths[employer]is  Address:  [address] [place], [place] [address]    SEDATING MEDICATIONS  Sedating medications include strong pain medications (e.g. narcotics), muscle relaxers, benzodiazepines (used for anxiety and as muscle relaxers), Benadryl/diphenhydramine and other antihistamines for allergic reactions/itching, and other medications.  If you are unsure if you have received a sedating medication, please ask your physician or nurse.  If you received a sedating medication: DO NOT drive a car. DO NOT operate machinery. DO NOT perform jobs where you need to be alert.  DO NOT drink alcoholic beverages while taking this medicine.     If you get dizzy, sit or lie down at the first signs. Be careful going up and down stairs.  Be extra careful to prevent falls.     Never give this medicine to others.     Keep this medicine out of reach of children.     Do not take or save old medicines. Throw them away when outdated.     Keep all medicines in a cool, dry place. DO NOT keep them in your bathroom medicine cabinet or in a cabinet above the stove.    MEDICATION REFILLS  Please be aware that we cannot refill any prescriptions through the ER. If you need further treatment from what is provided at your ER visit, please follow up with your primary care doctor or your pain management specialist.    FREESTANDING EMERGENCY DEPARTMENTS OF [facility]  Did you know [facility] has two freestanding ERs located just a few miles away?  [facility] and [facility] [place] have short wait times, easy free parking directly in front of the building and top patient satisfaction scores - and the same Board Certified Emergency Medicine doctors as [facility].

## 2016-12-07 NOTE — Progress Notes (Signed)
12/07/16 1041   Respiratory Parameters - Non Ventilated   Status Completed   Vital capacity 4100 mL   $ Vital Capacity Performed? Yes   Negative inspiratory force -50 cm H2o   $ NIF DONE Yes   Equipment labeled Yes   Adverse Reactions None

## 2016-12-08 LAB — METHYLMALONIC ACID, SERUM: Methylmalonic, Acid: 156 nmol/L (ref 87–318)

## 2016-12-08 LAB — GASTRIC PARIETAL CELL AB: Gastric Parietal Cell AB: <=20 (ref <=20.0)

## 2016-12-08 LAB — INTRINSIC FACTOR BLOCKING ANTIBODY: Intrinsic Factor Block Ab: NEGATIVE

## 2020-03-10 ENCOUNTER — Other Ambulatory Visit: Payer: Self-pay

## 2020-10-06 ENCOUNTER — Ambulatory Visit (INDEPENDENT_AMBULATORY_CARE_PROVIDER_SITE_OTHER): Payer: 59 | Admitting: Obstetrics and Gynecology

## 2020-10-06 ENCOUNTER — Other Ambulatory Visit (HOSPITAL_COMMUNITY)
Admission: RE | Admit: 2020-10-06 | Discharge: 2020-10-06 | Disposition: A | Discharge status: Home / Self Care | Payer: Self-pay | Source: Ambulatory Visit | Attending: Obstetrics and Gynecology | Admitting: Obstetrics and Gynecology

## 2020-10-06 ENCOUNTER — Other Ambulatory Visit: Payer: Self-pay

## 2020-10-06 ENCOUNTER — Encounter: Payer: Self-pay | Admitting: Obstetrics and Gynecology

## 2020-10-06 VITALS — BP 116/75 | HR 84 | Ht 63.0 in | Wt 119.0 lb

## 2020-10-06 DIAGNOSIS — B3731 Acute candidiasis of vulva and vagina: Secondary | ICD-10-CM

## 2020-10-06 DIAGNOSIS — B373 Candidiasis of vulva and vagina: Secondary | ICD-10-CM | POA: Diagnosis not present

## 2020-10-06 MED ORDER — FLUCONAZOLE 150 MG PO TABS: 150.0000 mg | ORAL_TABLET | Freq: Once | ORAL | 3 refills | Status: AC

## 2020-10-06 NOTE — Addendum Note (Signed)
Addended by: Dorian Pod on: 10/06/2020 08:53 AM   Modules accepted: Orders

## 2020-10-06 NOTE — Progress Notes (Signed)
HPI:      Robin Strong is a 25 y.o. G1P1001 who LMP was Patient's last menstrual period was 10/06/2020.  Subjective:   She presents today because she has recurrent yeast infections.  She has been tested on several occasions and diagnosed with yeast.  She has tried several rounds of Diflucan including using Diflucan 1 week/month every month.  She has had limited success with these regimens.  She did try boric acid for 1 week but experienced "microspots" during that week. She is currently having normal menstrual cycles and believes that her yeast infection occurs approximately 1 week prior to beginning her menses.  She is using condoms for birth control at this time. Of significant note for future birth control use patient has history of migraine with aura.     Hx: The following portions of the patient's history were reviewed and updated as appropriate:             She  has no past medical history on file. She does not have a problem list on file. She  has a past surgical history that includes Ovarian cyst removal (2011). Her family history is not on file. She  reports that she has never smoked. She has never used smokeless tobacco. She reports previous alcohol use. She reports previous drug use. She has a current medication list which includes the following prescription(s): amphetamine-dextroamphetamine and butalbital-acetaminophen-caffeine. She is allergic to monistat [miconazole].       Review of Systems:  Review of Systems  Constitutional: Denied constitutional symptoms, night sweats, recent illness, fatigue, fever, insomnia and weight loss.  Eyes: Denied eye symptoms, eye pain, photophobia, vision change and visual disturbance.  Ears/Nose/Throat/Neck: Denied ear, nose, throat or neck symptoms, hearing loss, nasal discharge, sinus congestion and sore throat.  Cardiovascular: Denied cardiovascular symptoms, arrhythmia, chest pain/pressure, edema, exercise intolerance, orthopnea and  palpitations.  Respiratory: Denied pulmonary symptoms, asthma, pleuritic pain, productive sputum, cough, dyspnea and wheezing.  Gastrointestinal: Denied, gastro-esophageal reflux, melena, nausea and vomiting.  Genitourinary: See HPI for additional information.  Musculoskeletal: Denied musculoskeletal symptoms, stiffness, swelling, muscle weakness and myalgia.  Dermatologic: Denied dermatology symptoms, rash and scar.  Neurologic: Denied neurology symptoms, dizziness, headache, neck pain and syncope.  Psychiatric: Denied psychiatric symptoms, anxiety and depression.  Endocrine: Denied endocrine symptoms including hot flashes and night sweats.   Meds:   Current Outpatient Medications on File Prior to Visit  Medication Sig Dispense Refill  . amphetamine-dextroamphetamine (ADDERALL) 20 MG tablet dextroamphetamine-amphetamine 20 mg tablet    . butalbital-acetaminophen-caffeine (FIORICET) 50-325-40 MG tablet Take by mouth.     No current facility-administered medications on file prior to visit.       Upstream - 10/06/20 0743      Pregnancy Intention Screening   Does the patient want to become pregnant in the next year? No    Does the patient's partner want to become pregnant in the next year? No    Would the patient like to discuss contraceptive options today? No      Contraception Wrap Up   Current Method Female Condom    End Method Female Condom    Contraception Counseling Provided Yes          The pregnancy intention screening data noted above was reviewed. Potential methods of contraception were discussed. The patient elected to proceed with Female Condom.     Objective:     Vitals:   10/06/20 0737  BP: 116/75  Pulse: 84   Filed Weights  10/06/20 0737  Weight: 119 lb (54 kg)              Physical examination   Pelvic:   Vulva: Normal appearance.  No lesions.  Vagina: No lesions or abnormalities noted.  Support: Normal pelvic support.  Urethra No masses tenderness or  scarring.  Meatus Normal size without lesions or prolapse.  Cervix: Normal appearance.  No lesions.  Anus: Normal exam.  No lesions.  Perineum: Normal exam.  No lesions.        Bimanual   Uterus: Normal size.  Non-tender.  Mobile.  AV.  Adnexae: No masses.  Non-tender to palpation.  Cul-de-sac: Negative for abnormality.     Assessment:    G1P1001 There are no problems to display for this patient.    1. 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   Plan:            1.  Nuswab performed to identify type of monilia involved.  2.  Patient has previously tried multiple strategies and has avoided douching, bubble baths, scented toilet tissue etc.  She is currently taking probiotics.  3.  We will plan 2-week course of Diflucan followed by boric acid.  4.  Boric acid nightly for 7 days followed by 3 times per week for 3 months.  5.  Should she fail that would consider weekly Diflucan for 6 months (see up to date) Orders No orders of the defined types were placed in this encounter.   No orders of the defined types were placed in this encounter.     F/U  No follow-ups on file. I spent 35 minutes involved in the care of this patient preparing to see the patient by obtaining and reviewing her medical history (including labs, imaging tests and prior procedures), documenting clinical information in the electronic health record (EHR), counseling and coordinating care plans, writing and sending prescriptions, ordering tests or procedures and directly communicating with the patient by discussing pertinent items from her history and physical exam as well as detailing my assessment and plan as noted above so that she has an informed understanding.  All of her questions were answered.  Elonda Husky, M.D. 10/06/2020 8:17 AM

## 2020-10-07 LAB — CERVICOVAGINAL ANCILLARY ONLY
Bacterial Vaginitis (gardnerella): NEGATIVE
Candida Glabrata: NEGATIVE
Candida Vaginitis: NEGATIVE
Comment: NEGATIVE
Comment: NEGATIVE
Comment: NEGATIVE

## 2020-12-06 ENCOUNTER — Encounter: Payer: Self-pay | Admitting: Emergency Medicine

## 2020-12-06 ENCOUNTER — Ambulatory Visit
Admission: EM | Admit: 2020-12-06 | Discharge: 2020-12-06 | Disposition: A | Discharge status: Home / Self Care | Payer: 59 | Attending: Family Medicine | Admitting: Family Medicine

## 2020-12-06 ENCOUNTER — Other Ambulatory Visit: Payer: Self-pay

## 2020-12-06 DIAGNOSIS — S161XXA Strain of muscle, fascia and tendon at neck level, initial encounter: Secondary | ICD-10-CM

## 2020-12-06 MED ORDER — MELOXICAM 15 MG PO TABS: 15.0000 mg | ORAL_TABLET | Freq: Every day | ORAL | 0 refills | Status: DC | PRN
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
## 2020-12-06 NOTE — Discharge Instructions (Signed)
Rest. Heat.  Medication as needed.  Take care  Dr. Dreux Mcgroarty  

## 2021-01-05 ENCOUNTER — Other Ambulatory Visit: Payer: Self-pay

## 2021-01-05 ENCOUNTER — Encounter: Payer: Self-pay | Admitting: Obstetrics and Gynecology

## 2021-01-05 ENCOUNTER — Ambulatory Visit: Payer: 59 | Admitting: Obstetrics and Gynecology

## 2021-01-05 VITALS — BP 119/78 | HR 63 | Ht 63.0 in | Wt 120.7 lb

## 2021-01-05 DIAGNOSIS — B373 Candidiasis of vulva and vagina: Secondary | ICD-10-CM | POA: Diagnosis not present

## 2021-01-05 DIAGNOSIS — B3731 Acute candidiasis of vulva and vagina: Secondary | ICD-10-CM

## 2021-01-05 MED ORDER — FLUCONAZOLE 150 MG PO TABS: 150.0000 mg | ORAL_TABLET | Freq: Once | ORAL | 3 refills | Status: AC

## 2021-01-05 NOTE — Progress Notes (Addendum)
HPI:      [name] is a 25 y.o. G1P1001 who LMP was Patient's last menstrual period was 01/04/2021.  Subjective:   She presents today for follow-up of recurrent monilia vulvovaginitis.  The patient states that she gets a yeast infection every month the week prior to her menses.  She has tried systematic use of probiotics and use of boric acid 3 times per week.  She has not noticed any change in the pattern of her yeast infection.    Hx: The following portions of the patient's history were reviewed and updated as appropriate:             She  has no past medical history on file. She does not have a problem list on file. She  has a past surgical history that includes Ovarian cyst removal (2011). Her family history is not on file. She  reports that she has never smoked. She has never used smokeless tobacco. She reports previous alcohol use. She reports previous drug use. She has a current medication list which includes the following prescription(s): amphetamine-dextroamphetamine, butalbital-acetaminophen-caffeine, fluconazole, and meloxicam. She is allergic to monistat [miconazole].       Review of Systems:  Review of Systems  Constitutional: Denied constitutional symptoms, night sweats, recent illness, fatigue, fever, insomnia and weight loss.  Eyes: Denied eye symptoms, eye pain, photophobia, vision change and visual disturbance.  Ears/Nose/Throat/Neck: Denied ear, nose, throat or neck symptoms, hearing loss, nasal discharge, sinus congestion and sore throat.  Cardiovascular: Denied cardiovascular symptoms, arrhythmia, chest pain/pressure, edema, exercise intolerance, orthopnea and palpitations.  Respiratory: Denied pulmonary symptoms, asthma, pleuritic pain, productive sputum, cough, dyspnea and wheezing.  Gastrointestinal: Denied, gastro-esophageal reflux, melena, nausea and vomiting.  Genitourinary: See HPI for additional information.  Musculoskeletal: Denied musculoskeletal  symptoms, stiffness, swelling, muscle weakness and myalgia.  Dermatologic: Denied dermatology symptoms, rash and scar.  Neurologic: Denied neurology symptoms, dizziness, headache, neck pain and syncope.  Psychiatric: Denied psychiatric symptoms, anxiety and depression.  Endocrine: Denied endocrine symptoms including hot flashes and night sweats.   Meds:   Current Outpatient Medications on File Prior to Visit  Medication Sig Dispense Refill   amphetamine-dextroamphetamine (ADDERALL) 20 MG tablet dextroamphetamine-amphetamine 20 mg tablet     butalbital-acetaminophen-caffeine (FIORICET) 50-325-40 MG tablet Take by mouth.     meloxicam (MOBIC) 15 MG tablet Take 1 tablet (15 mg total) by mouth daily as needed for pain. (Patient not taking: Reported on 01/05/2021) 30 tablet 0   No current facility-administered medications on file prior to visit.          Objective:     Vitals:   01/05/21 0848  BP: 119/78  Pulse: 63   Filed Weights   01/05/21 0848  Weight: 120 lb 11.2 oz (54.7 kg)                Assessment:    G1P1001 There are no problems to display for this patient.    1. Monilial vulvovaginitis     Recurrent-each month 1 week prior to her menses.   Plan:            1.  We have discussed multiple management schemes and now having failed boric acid and probiotics we will try Diflucan. She has chosen to take Diflucan 1 time the week prior to her menses.  If this does not work she will go to twice during that same week.  Should both of these strategies fail we will consider 41-month use of Diflucan each  week. Orders No orders of the defined types were placed in this encounter.    Meds ordered this encounter  Medications   fluconazole (DIFLUCAN) 150 MG tablet    Sig: Take 1 tablet (150 mg total) by mouth once for 1 dose. As directed the week prior to menses    Dispense:  5 tablet    Refill:  3      F/U  Return in about 4 months (around 05/08/2021) for Annual  Physical. I spent 22 minutes involved in the care of this patient preparing to see the patient by obtaining and reviewing her medical history (including labs, imaging tests and prior procedures), documenting clinical information in the electronic health record (EHR), counseling and coordinating care plans, writing and sending prescriptions, ordering tests or procedures and directly communicating with the patient by discussing pertinent items from her history and physical exam as well as detailing my assessment and plan as noted above so that she has an informed understanding.  All of her questions were answered.  Elonda Husky, M.D. 01/05/2021 9:09 AM

## 2021-05-10 NOTE — Patient Instructions (Incomplete)
Breast Self-Awareness °Breast self-awareness is knowing how your breasts look and feel. Doing breast self-awareness is important. It allows you to catch a breast problem early while it is still small and can be treated. All women should do breast self-awareness, including women who have had breast implants. Tell your doctor if you notice a change in your breasts. °What you need: °A mirror. °A well-lit room. °How to do a breast self-exam °A breast self-exam is one way to learn what is normal for your breasts and to check for changes. To do a breast self-exam: °Look for changes ° °Take off all the clothes above your waist. °Stand in front of a mirror in a room with good lighting. °Put your hands on your hips. °Push your hands down. °Look at your breasts and nipples in the mirror to see if one breast or nipple looks different from the other. Check to see if: °The shape of one breast is different. °The size of one breast is different. °There are wrinkles, dips, and bumps in one breast and not the other. °Look at each breast for changes in the skin, such as: °Redness. °Scaly areas. °Look for changes in your nipples, such as: °Liquid around the nipples. °Bleeding. °Dimpling. °Redness. °A change in where the nipples are. °Feel for changes ° °Lie on your back on the floor. °Feel each breast. To do this, follow these steps: °Pick a breast to feel. °Put the arm closest to that breast above your head. °Use your other arm to feel the nipple area of your breast. Feel the area with the pads of your three middle fingers by making small circles with your fingers. For the first circle, press lightly. For the second circle, press harder. For the third circle, press even harder. °Keep making circles with your fingers at the different pressures as you move down your breast. Stop when you feel your ribs. °Move your fingers a little toward the center of your body. °Start making circles with your fingers again, this time going up until  you reach your collarbone. °Keep making up-and-down circles until you reach your armpit. Remember to keep using the three pressures. °Feel the other breast in the same way. °Sit or stand in the tub or shower. °With soapy water on your skin, feel each breast the same way you did in step 2 when you were lying on the floor. °Write down what you find °Writing down what you find can help you remember what to tell your doctor. Write down: °What is normal for each breast. °Any changes you find in each breast, including: °The kind of changes you find. °Whether you have pain. °Size and location of any lumps. °When you last had your menstrual period. °General tips °Check your breasts every month. °If you are breastfeeding, the best time to check your breasts is after you feed your baby or after you use a breast pump. °If you get menstrual periods, the best time to check your breasts is 5-7 days after your menstrual period is over. °With time, you will become comfortable with the self-exam, and you will begin to know if there are changes in your breasts. °Contact a doctor if you: °See a change in the shape or size of your breasts or nipples. °See a change in the skin of your breast or nipples, such as red or scaly skin. °Have fluid coming from your nipples that is not normal. °Find a lump or thick area that was not there before. °Have pain in   your breasts. °Have any concerns about your breast health. °Summary °Breast self-awareness includes looking for changes in your breasts, as well as feeling for changes within your breasts. °Breast self-awareness should be done in front of a mirror in a well-lit room. °You should check your breasts every month. If you get menstrual periods, the best time to check your breasts is 5-7 days after your menstrual period is over. °Let your doctor know of any changes you see in your breasts, including changes in size, changes on the skin, pain or tenderness, or fluid from your nipples that is not  normal. °This information is not intended to replace advice given to you by your health care provider. Make sure you discuss any questions you have with your health care provider. °Document Revised: 02/06/2018 Document Reviewed: 02/06/2018 °Elsevier Patient Education © 2022 [employer] °Preventive Care 21-39 Years Old, Female °Preventive care refers to lifestyle choices and visits with your health care provider that can promote health and wellness. Preventive care visits are also called wellness exams. °What can I expect for my preventive care visit? °Counseling °During your preventive care visit, your health care provider may ask about your: °Medical history, including: °Past medical problems. °Family medical history. °Pregnancy history. °Current health, including: °Menstrual cycle. °Method of birth control. °Emotional well-being. °Home life and relationship well-being. °Sexual activity and sexual health. °Lifestyle, including: °Alcohol, nicotine or tobacco, and drug use. °Access to firearms. °Diet, exercise, and sleep habits. °Work and work environment. °Sunscreen use. °Safety issues such as seatbelt and bike helmet use. °Physical exam °Your health care provider may check your: °Height and weight. These may be used to calculate your BMI (body mass index). BMI is a measurement that tells if you are at a healthy weight. °Waist circumference. This measures the distance around your waistline. This measurement also tells if you are at a healthy weight and may help predict your risk of certain diseases, such as type 2 diabetes and high blood pressure. °Heart rate and blood pressure. °Body temperature. °Skin for abnormal spots. °What immunizations do I need? °Vaccines are usually given at various ages, according to a schedule. Your health care provider will recommend vaccines for you based on your age, medical history, and lifestyle or other factors, such as travel or where you work. °What tests do I  need? °Screening °Your health care provider may recommend screening tests for certain conditions. This may include: °Pelvic exam and Pap test. °Lipid and cholesterol levels. °Diabetes screening. This is done by checking your blood sugar (glucose) after you have not eaten for a while (fasting). °Hepatitis B test. °Hepatitis C test. °HIV (human immunodeficiency virus) test. °STI (sexually transmitted infection) testing, if you are at risk. °BRCA-related cancer screening. This may be done if you have a family history of breast, ovarian, tubal, or peritoneal cancers. °Talk with your health care provider about your test results, treatment options, and if necessary, the need for more tests. °Follow these instructions at home: °Eating and drinking ° °Eat a healthy diet that includes fresh fruits and vegetables, whole grains, lean protein, and low-fat dairy products. °Take vitamin and mineral supplements as recommended by your health care provider. °Do not drink alcohol if: °Your health care provider tells you not to drink. °You are pregnant, may be pregnant, or are planning to become pregnant. °If you drink alcohol: °Limit how much you have to 0-1 drink a day. °Know how much alcohol is in your drink. In the [place] one drink equals one 12 oz   bottle of beer (355 mL), one 5 oz glass of wine (148 mL), or one 1½ oz glass of hard liquor (44 mL). °Lifestyle °Brush your teeth every morning and night with fluoride toothpaste. Floss one time each day. °Exercise for at least 30 minutes 5 or more days each week. °Do not use any products that contain nicotine or tobacco. These products include cigarettes, chewing tobacco, and vaping devices, such as e-cigarettes. If you need help quitting, ask your health care provider. °Do not use drugs. °If you are sexually active, practice safe sex. Use a condom or other form of protection to prevent STIs. °If you do not wish to become pregnant, use a form of birth control. If you plan to become  pregnant, see your health care provider for a prepregnancy visit. °Find healthy ways to manage stress, such as: °Meditation, yoga, or listening to music. °Journaling. °Talking to a trusted person. °Spending time with friends and family. °Minimize exposure to UV radiation to reduce your risk of skin cancer. °Safety °Always wear your seat belt while driving or riding in a vehicle. °Do not drive: °If you have been drinking alcohol. Do not ride with someone who has been drinking. °If you have been using any mind-altering substances or drugs. °While texting. °When you are tired or distracted. °Wear a helmet and other protective equipment during sports activities. °If you have firearms in your house, make sure you follow all gun safety procedures. °Seek help if you have been physically or sexually abused. °What's next? °Go to your health care provider once a year for an annual wellness visit. °Ask your health care provider how often you should have your eyes and teeth checked. °Stay up to date on all vaccines. °This information is not intended to replace advice given to you by your health care provider. Make sure you discuss any questions you have with your health care provider. °Document Revised: 12/16/2020 Document Reviewed: 12/16/2020 °Elsevier Patient Education © 2022 [employer] ° °

## 2021-05-11 ENCOUNTER — Encounter: Payer: 59 | Admitting: Obstetrics and Gynecology

## 2021-05-11 DIAGNOSIS — Z01419 Encounter for gynecological examination (general) (routine) without abnormal findings: Secondary | ICD-10-CM

## 2021-06-29 ENCOUNTER — Ambulatory Visit (INDEPENDENT_AMBULATORY_CARE_PROVIDER_SITE_OTHER): Payer: 59 | Admitting: Obstetrics and Gynecology

## 2021-06-29 ENCOUNTER — Encounter: Payer: Self-pay | Admitting: Obstetrics and Gynecology

## 2021-06-29 ENCOUNTER — Other Ambulatory Visit (HOSPITAL_COMMUNITY)
Admission: RE | Admit: 2021-06-29 | Discharge: 2021-06-29 | Disposition: A | Discharge status: Home / Self Care | Payer: 59 | Source: Ambulatory Visit | Attending: Obstetrics and Gynecology | Admitting: Obstetrics and Gynecology

## 2021-06-29 ENCOUNTER — Other Ambulatory Visit: Payer: Self-pay

## 2021-06-29 VITALS — BP 108/66 | HR 78 | Resp 16 | Ht 63.0 in | Wt 122.6 lb

## 2021-06-29 DIAGNOSIS — N930 Postcoital and contact bleeding: Secondary | ICD-10-CM | POA: Diagnosis not present

## 2021-06-29 DIAGNOSIS — Z124 Encounter for screening for malignant neoplasm of cervix: Secondary | ICD-10-CM | POA: Insufficient documentation

## 2021-06-29 DIAGNOSIS — Z01419 Encounter for gynecological examination (general) (routine) without abnormal findings: Secondary | ICD-10-CM | POA: Diagnosis present

## 2021-06-29 DIAGNOSIS — B3731 Acute candidiasis of vulva and vagina: Secondary | ICD-10-CM

## 2021-06-29 MED ORDER — FLUCONAZOLE 150 MG PO TABS: 150.0000 mg | ORAL_TABLET | ORAL | 2 refills | Status: AC

## 2021-06-29 NOTE — Progress Notes (Signed)
HPI:      Robin Strong is a 25 y.o. G1P1001 who LMP was Patient's last menstrual period was 06/11/2021 (exact date).  Subjective:   She presents today for her annual examination.  She states that using Diflucan 1 week prior to her menses did not help with her recurrent monilia infections.  She states that she still has them monthly.  She has tried changing her diet to low sugar and gluten free and this has "helped a little". She is now stating that she is having recurrent postcoital bleeding.  Usually immediately after intercourse.  She says this is occasionally accompanied by right-sided pelvic pain.    Hx: The following portions of the patient's history were reviewed and updated as appropriate:             She  has no past medical history on file. She does not have a problem list on file. She  has a past surgical history that includes Ovarian cyst removal (2011). Her family history is not on file. She  reports that she has never smoked. She has never used smokeless tobacco. She reports that she does not currently use alcohol. She reports that she does not currently use drugs. She has a current medication list which includes the following prescription(s): amphetamine-dextroamphetamine, butalbital-acetaminophen-caffeine, and fluconazole. She is allergic to monistat [miconazole].       Review of Systems:  Review of Systems  Constitutional: Denied constitutional symptoms, night sweats, recent illness, fatigue, fever, insomnia and weight loss.  Eyes: Denied eye symptoms, eye pain, photophobia, vision change and visual disturbance.  Ears/Nose/Throat/Neck: Denied ear, nose, throat or neck symptoms, hearing loss, nasal discharge, sinus congestion and sore throat.  Cardiovascular: Denied cardiovascular symptoms, arrhythmia, chest pain/pressure, edema, exercise intolerance, orthopnea and palpitations.  Respiratory: Denied pulmonary symptoms, asthma, pleuritic pain, productive sputum, cough,  dyspnea and wheezing.  Gastrointestinal: Denied, gastro-esophageal reflux, melena, nausea and vomiting.  Genitourinary: See HPI for additional information.  Musculoskeletal: Denied musculoskeletal symptoms, stiffness, swelling, muscle weakness and myalgia.  Dermatologic: Denied dermatology symptoms, rash and scar.  Neurologic: Denied neurology symptoms, dizziness, headache, neck pain and syncope.  Psychiatric: Denied psychiatric symptoms, anxiety and depression.  Endocrine: Denied endocrine symptoms including hot flashes and night sweats.   Meds:   Current Outpatient Medications on File Prior to Visit  Medication Sig Dispense Refill   amphetamine-dextroamphetamine (ADDERALL) 20 MG tablet dextroamphetamine-amphetamine 20 mg tablet     butalbital-acetaminophen-caffeine (FIORICET) 50-325-40 MG tablet Take by mouth.     No current facility-administered medications on file prior to visit.      Objective:     Vitals:   06/29/21 0825  BP: 108/66  Pulse: 78  Resp: 16    Filed Weights   06/29/21 0825  Weight: 122 lb 9.6 oz (55.6 kg)              Physical examination General NAD, Conversant  HEENT Atraumatic; Op clear with mmm.  Normo-cephalic. Pupils reactive. Anicteric sclerae  Thyroid/Neck Smooth without nodularity or enlargement. Normal ROM.  Neck Supple.  Skin No rashes, lesions or ulceration. Normal palpated skin turgor. No nodularity.  Breasts: No masses or discharge.  Symmetric.  No axillary adenopathy.  Bilateral nipple piercing  Lungs: Clear to auscultation.No rales or wheezes. Normal Respiratory effort, no retractions.  Heart: NSR.  No murmurs or rubs appreciated. No periferal edema  Abdomen: Soft.  Non-tender.  No masses.  No HSM. No hernia  Extremities: Moves all appropriately.  Normal ROM for age. No  lymphadenopathy.  Neuro: Oriented to PPT.  Normal mood. Normal affect.     Pelvic:   Vulva: Normal appearance.  No lesions.  Vagina: No lesions or abnormalities  noted.  Support: Normal pelvic support.  Urethra No masses tenderness or scarring.  Meatus Normal size without lesions or prolapse.  Cervix: Normal appearance.  No lesions.  Moderately friable  Anus: Normal exam.  No lesions.  Perineum: Normal exam.  No lesions.        Bimanual   Uterus: Normal size.  Non-tender.  Mobile.  AV.  Adnexae: No masses.  Non-tender to palpation.  Cul-de-sac: Negative for abnormality.   Silver nitrate applied to 2 small friable areas.  Assessment:    G1P1001 There are no problems to display for this patient.    1. Encounter for well woman exam with routine gynecological exam   2. Cervical cancer screening   3. Monilial vulvovaginitis   4. PCB (post coital bleeding)     Continued recurrent monilia  Possible friable cervix causing postcoital bleeding.   Plan:            1.  Basic Screening Recommendations The basic screening recommendations for asymptomatic women were discussed with the patient during her visit.  The age-appropriate recommendations were discussed with her and the rational for the tests reviewed.  When I am informed by the patient that another primary care physician has previously obtained the age-appropriate tests and they are up-to-date, only outstanding tests are ordered and referrals given as necessary.  Abnormal results of tests will be discussed with her when all of her results are completed.  Routine preventative health maintenance measures emphasized: Exercise/Diet/Weight control, Tobacco Warnings, Alcohol/Substance use risks and Stress Management Pap performed-GC/CT performed 2.  Diflucan weekly for 3 months -recurrent vaginitis 3.  If postcoital bleeding continues consider pelvic ultrasound. Orders No orders of the defined types were placed in this encounter.   No orders of the defined types were placed in this encounter.         F/U  Return in about 3 months (around 09/27/2021).  Elonda Husky, M.D. 06/29/2021 8:42  AM

## 2021-07-02 LAB — CYTOLOGY - PAP
Chlamydia: NEGATIVE
Comment: NEGATIVE
Comment: NORMAL
Diagnosis: NEGATIVE
Diagnosis: REACTIVE
Neisseria Gonorrhea: NEGATIVE

## 2021-09-28 ENCOUNTER — Encounter: Payer: Self-pay | Admitting: Obstetrics and Gynecology

## 2021-09-28 ENCOUNTER — Other Ambulatory Visit: Payer: Self-pay

## 2021-09-28 ENCOUNTER — Ambulatory Visit (INDEPENDENT_AMBULATORY_CARE_PROVIDER_SITE_OTHER): Payer: Self-pay | Admitting: Obstetrics and Gynecology

## 2021-09-28 VITALS — BP 117/80 | HR 83 | Ht 63.0 in | Wt 117.1 lb

## 2021-09-28 DIAGNOSIS — N930 Postcoital and contact bleeding: Secondary | ICD-10-CM

## 2021-09-28 DIAGNOSIS — B3731 Acute candidiasis of vulva and vagina: Secondary | ICD-10-CM

## 2021-09-28 MED ORDER — TERCONAZOLE 0.4 % VA CREA: 1.0000 | TOPICAL_CREAM | Freq: Every day | VAGINAL | 0 refills | Status: DC

## 2021-09-28 NOTE — Progress Notes (Signed)
HPI: ?     Ms. Allyssa Abruzzese is a 26 y.o. G1P1001 who LMP was Patient's last menstrual period was 09/05/2021. ? ?Subjective:  ? ?She presents today stating that she has used Diflucan as indicated regularly for the last 3 months and it has made little difference regarding her recurrent yeast infections.  She does state that the infections seem to occur midcycle at the time of ovulation and that the Diflucan mitigates some of the symptoms but does not resolve them.  She has looked into other treatments other than Diflucan and she is possibly interested in trying brexafemme which is a brand-new antifungal. ?Additionally she continues to have postcoital bleeding.  At her last visit we noted that she had a friable cervix and chemical cauterization was performed-obviously without success.  Her last Pap smear was normal ? ?  Hx: ?The following portions of the patient's history were reviewed and updated as appropriate: ?            She  has no past medical history on file. ?She does not have a problem list on file. ?She  has a past surgical history that includes Ovarian cyst removal (2011). ?Her family history is not on file. ?She  reports that she has never smoked. She has never used smokeless tobacco. She reports that she does not currently use alcohol. She reports that she does not currently use drugs. ?She has a current medication list which includes the following prescription(s): amphetamine-dextroamphetamine and terconazole. ?She is allergic to monistat [miconazole]. ?      ?Review of Systems:  ?Review of Systems ? ?Constitutional: Denied constitutional symptoms, night sweats, recent illness, fatigue, fever, insomnia and weight loss.  ?Eyes: Denied eye symptoms, eye pain, photophobia, vision change and visual disturbance.  ?Ears/Nose/Throat/Neck: Denied ear, nose, throat or neck symptoms, hearing loss, nasal discharge, sinus congestion and sore throat.  ?Cardiovascular: Denied cardiovascular symptoms, arrhythmia, chest  pain/pressure, edema, exercise intolerance, orthopnea and palpitations.  ?Respiratory: Denied pulmonary symptoms, asthma, pleuritic pain, productive sputum, cough, dyspnea and wheezing.  ?Gastrointestinal: Denied, gastro-esophageal reflux, melena, nausea and vomiting.  ?Genitourinary: See HPI for additional information.  ?Musculoskeletal: Denied musculoskeletal symptoms, stiffness, swelling, muscle weakness and myalgia.  ?Dermatologic: Denied dermatology symptoms, rash and scar.  ?Neurologic: Denied neurology symptoms, dizziness, headache, neck pain and syncope.  ?Psychiatric: Denied psychiatric symptoms, anxiety and depression.  ?Endocrine: Denied endocrine symptoms including hot flashes and night sweats.  ? ?Meds: ?  ?Current Outpatient Medications on File Prior to Visit  ?Medication Sig Dispense Refill  ? amphetamine-dextroamphetamine (ADDERALL) 20 MG tablet dextroamphetamine-amphetamine 20 mg tablet    ? ?No current facility-administered medications on file prior to visit.  ? ? ? ? ?Objective:  ?  ? ?Vitals:  ? 09/28/21 0830  ?BP: 117/80  ?Pulse: 83  ? ?Filed Weights  ? 09/28/21 0830  ?Weight: 117 lb 1.6 oz (53.1 kg)  ? ?  ?          ?        ? ?Assessment:  ?  ?G1P1001 ?There are no problems to display for this patient. ? ?  ?1. Monilial vulvovaginitis   ?2. PCB (post coital bleeding)   ? ? 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 ? Continue postcoital bleeding despite chemical cauterization. (Friable cervix) ? ? ?Plan:  ?  ?       ? 1.  We have discussed multiple options for recurrent monilia vaginitis.  Patient is willing to try Terazol cream.  Should  she fail this she says she would be interested in [place] despite its exorbitant cost. ? 2.  We have also discussed postcoital bleeding and the possibility of undergoing coagulation with the ball electrode.  At this time she would like to see if she can get the yeast resolved before progressing further, but she is indeed interested in  resolving this issue as well. ? ?Orders ?No orders of the defined types were placed in this encounter. ? ?  ?Meds ordered this encounter  ?Medications  ? terconazole (TERAZOL 7) 0.4 % vaginal cream  ?  Sig: Place 1 applicator vaginally at bedtime.  ?  Dispense:  45 g  ?  Refill:  0  ?  ?  F/U ? Return for Pt to contact us if symptoms worsen. ?I spent 24 minutes involved in the care of this patient preparing to see the patient by obtaining and reviewing her medical history (including labs, imaging tests and prior procedures), documenting clinical information in the electronic health record (EHR), counseling and coordinating care plans, writing and sending prescriptions, ordering tests or procedures and in direct communicating with the patient and medical staff discussing pertinent items from her history and physical exam. ? ?Elonda Husky, M.D. ?09/28/2021 ?9:20 AM ? ? ? ? ?

## 2021-09-28 NOTE — Progress Notes (Signed)
Patient presents today for 3 month follow-up. She states she is still having yeast infections, states she has continued taking Diflucan for 3 months along with OTC treatments and natural remedies, no help. Patient states she is still having bleeding after intercourse. Patient states no other questions or concerns at this time.  ?

## 2023-11-17 ENCOUNTER — Emergency Department (HOSPITAL_COMMUNITY)
Admission: EM | Admit: 2023-11-17 | Discharge: 2023-11-17 | Disposition: A | Discharge status: Home / Self Care | Payer: Self-pay | Attending: Emergency Medicine | Admitting: Emergency Medicine

## 2023-11-17 ENCOUNTER — Other Ambulatory Visit: Payer: Self-pay

## 2023-11-17 DIAGNOSIS — H16002 Unspecified corneal ulcer, left eye: Secondary | ICD-10-CM | POA: Insufficient documentation

## 2023-11-17 MED ORDER — FLUORESCEIN SODIUM 1 MG OP STRP
1.0000 | ORAL_STRIP | Freq: Once | OPHTHALMIC | Status: DC
  Filled 2023-11-17: qty 1

## 2023-11-17 MED ORDER — FLUORESCEIN SODIUM 1 MG OP STRP
1.0000 | ORAL_STRIP | Freq: Once | OPHTHALMIC | Status: AC
  Administered 2023-11-17: 1 via OPHTHALMIC

## 2023-11-17 MED ORDER — GATIFLOXACIN 0.5 % OP SOLN
1.0000 [drp] | OPHTHALMIC | Status: DC
  Administered 2023-11-17: 1 [drp] via OPHTHALMIC
  Filled 2023-11-17: qty 2.5

## 2023-11-17 MED ORDER — TETRACAINE HCL 0.5 % OP SOLN
1.0000 [drp] | Freq: Once | OPHTHALMIC | Status: AC
  Administered 2023-11-17: 1 [drp] via OPHTHALMIC
  Filled 2023-11-17: qty 4

## 2023-11-17 NOTE — ED Notes (Signed)
 This RN received report from [facility]. Pt presents to the ED with c/o L eye pain after removing contacts. Pt voiced still feeling mild discomfort and seeing floaters. Pt sitting up in bed, VSS. Pt on monitor and call light within reach.

## 2023-11-17 NOTE — ED Provider Triage Note (Signed)
 Emergency Medicine Provider Triage Evaluation Note  Sindhuja Bolstad , a 28 y.o. female  was evaluated in triage.  Pt complains of seeing spots left eye.  Pt reports redness started Tuesday.  Pt started on tobrex.  Pt reports decreased vision   Review of Systems  Positive: Decreased vision  Negative: injury  Physical Exam  BP 129/88 (BP Location: Right Arm)   Pulse 99   Temp 97.6 F (36.4 C)   Resp 18   SpO2 100%  Gen:   Awake, no distress   Resp:  Normal effort  MSK:   Moves extremities without difficulty  Other:    Medical Decision Making  Medically screening exam initiated at 2:36 PM.  Appropriate orders placed.  Genavive Melikian was informed that the remainder of the evaluation will be completed by another provider, this initial triage assessment does not replace that evaluation, and the importance of remaining in the ED until their evaluation is complete.        Charge Rn asked to room pt    Sandi Crosby, PA-C 11/17/23 1062

## 2023-11-17 NOTE — Discharge Instructions (Addendum)
 Use the antibiotic eyedrops, gatifloxacin, 1 drop every 2 hours.  Get 2 more doses in tonight before going to bed.  If you are awake in his past 2 hours put another drop in.  Do the same when you get up in the morning.  You do not have to wake up every 2 hours during the night.  He is expecting to see in his office tomorrow at 2 PM.  Also probably be helpful if you would call his office number 30 minutes before your arrival.  That will give them a heads up that you are on the way.  Also notify him if there is any delays just leave a message with his office number.  He is going to be able to check that.  Make sure somebody who can drive comes with you.  It is okay to take Naprosyn/Aleve or Motrin/Advil for pain.  Can also take extra strength Tylenol for pain.

## 2023-11-17 NOTE — ED Triage Notes (Signed)
 Patient with L eye problems since Tuesday. Was red and irritated and went to [facility] Wednesday where they gave her abx drops and lubricant drops which she has been using without relief. Has had blurry vision, floaters, and black spots in vision of L eye since yesterday.

## 2023-11-17 NOTE — ED Notes (Signed)
 This RN educated pt on eye drop medication that pt is taking home with her. Pt voiced understanding. Pt voiced no questions during discharge instructions.

## 2023-11-17 NOTE — ED Provider Notes (Addendum)
 [place] EMERGENCY DEPARTMENT AT [facility] Provider Note   CSN: [id number] Arrival date & time: 11/17/23  1242     History  Chief Complaint  Patient presents with   Eye Problem    Robin Strong is a 28 y.o. female.  Patient is a contact wearer.  Patient works overnight as a Engineer, civil (consulting).  She got home Tuesday morning took her contacts out and then developed irritation and itching in the left eye.  She went to urgent care on Wednesday they told her she could have a corneal abrasion or could be conjunctivitis they started her on tobramycin.  Symptoms of gotten worse since then.  She has not put her contacts back in since then.  She has noted some blurry vision floaters did call ophthalmology at [facility] and they did not have an opening.  They recommended that she go to the emergency department there.  Patient without any significant headache or nausea vomiting.  Temperature 97.6 pulse 99 respirations 18 blood pressure 129/88 oxygen saturation is 100% on room air.  Past medical history noncontributory past surgical history ovarian cyst removal in 2011.  Patient is followed by my doctors for her eye care but they did not have anybody in the office.       Home Medications Prior to Admission medications   Medication Sig Start Date End Date Taking? Authorizing Provider  amphetamine-dextroamphetamine (ADDERALL XR) 15 MG 24 hr capsule Take 15 mg by mouth every morning.   Yes [provider]  TOBRAMYCIN OP Place 2 drops into the left eye every 4 (four) hours.   Yes [provider]  traZODone (DESYREL) 50 MG tablet Take 50 mg by mouth at bedtime as needed for sleep. 09/22/23  Yes [provider]  amphetamine-dextroamphetamine (ADDERALL) 20 MG tablet dextroamphetamine-amphetamine 20 mg tablet Patient not taking: Reported on 11/17/2023 09/03/20   [provider]      Allergies    Monistat [miconazole]    Review of Systems   Review of Systems   Constitutional:  Negative for chills and fever.  HENT:  Negative for ear pain and sore throat.   Eyes:  Positive for photophobia, pain, redness, itching and visual disturbance.  Respiratory:  Negative for cough and shortness of breath.   Cardiovascular:  Negative for chest pain and palpitations.  Gastrointestinal:  Negative for abdominal pain and vomiting.  Genitourinary:  Negative for dysuria and hematuria.  Musculoskeletal:  Negative for arthralgias and back pain.  Skin:  Negative for color change and rash.  Neurological:  Negative for seizures and syncope.  All other systems reviewed and are negative.   Physical Exam Updated Vital Signs BP 109/76 (BP Location: Right Arm)   Pulse 86   Temp 98.9 F (37.2 C) (Oral)   Resp 17   SpO2 100%  Physical Exam Vitals and nursing note reviewed.  Constitutional:      General: She is not in acute distress.    Appearance: Normal appearance. She is well-developed.  HENT:     Head: Normocephalic and atraumatic.  Eyes:     Conjunctiva/sclera: Conjunctivae normal.     Comments: Left eye sclera is erythematous.  Visualization of the anterior chamber appears normal no hyphema.  Can see right midportion center of the cornea clear milking this.  Most likely consistent with corneal abrasion or corneal ulcer.  Cardiovascular:     Rate and Rhythm: Normal rate and regular rhythm.     Heart sounds: No murmur heard. Pulmonary:  Effort: Pulmonary effort is normal. No respiratory distress.     Breath sounds: Normal breath sounds.  Abdominal:     Palpations: Abdomen is soft.     Tenderness: There is no abdominal tenderness.  Musculoskeletal:        General: No swelling.     Cervical back: Neck supple.  Skin:    General: Skin is warm and dry.     Capillary Refill: Capillary refill takes less than 2 seconds.  Neurological:     General: No focal deficit present.     Mental Status: She is alert and oriented to person, place, and time.   Psychiatric:        Mood and Affect: Mood normal.     ED Results / Procedures / Treatments   Labs (all labs ordered are listed, but only abnormal results are displayed) Labs Reviewed - No data to display  EKG None  Radiology No results found.  Procedures Procedures    Medications Ordered in ED Medications  fluorescein ophthalmic strip 1 strip (has no administration in time range)  tetracaine (PONTOCAINE) 0.5 % ophthalmic solution 1 drop (1 drop Both Eyes Given 11/17/23 1837)  fluorescein ophthalmic strip 1 strip (1 strip Left Eye Given 11/17/23 1838)    ED Course/ Medical Decision Making/ A&P                                 Medical Decision Making Risk Prescription drug management.  Some difficulty getting the equipment.  But fluorescein staining and UV black light confirms what I can see with the naked eye.  There is about a 1 mm lesion in the center part of the cornea.  Suggestive maybe of a little bit of ulceration.  No dendritic changes.  Will discuss with on-call ophthalmology since this is Friday evening and she had already been on tobramycin.  Patient also does feel better after the numbing medicine.  Discussed with on-call ophthalmology Dr. Ambrosio Junker.  He took a look at a photograph of the eyes is definitely consistent with a corneal ulcer that was my suspicion as well.  Once here to get moxifloxacin here and then 2 more doses in before she goes to bed.  I will see her in the office tomorrow at 1400 patient will be given office number.  CRITICAL CARE Performed by: Misti Towle Total critical care time: 45 minutes Critical care time was exclusive of separately billable procedures and treating other patients. Critical care was necessary to treat or prevent imminent or life-threatening deterioration. Critical care was time spent personally by me on the following activities: development of treatment plan with patient and/or surrogate as well as nursing, discussions  with consultants, evaluation of patient's response to treatment, examination of patient, obtaining history from patient or surrogate, ordering and performing treatments and interventions, ordering and review of laboratory studies, ordering and review of radiographic studies, pulse oximetry and re-evaluation of patient's condition.   Final Clinical Impression(s) / ED Diagnoses Final diagnoses:  Corneal ulcer of left eye    Rx / DC Orders ED Discharge Orders     None         Nicklas Barns, MD 11/17/23 7829    Nicklas Barns, MD 11/17/23 2000
